# Patient Record
Sex: Male | Born: 1975 | Race: White | Hispanic: No | Marital: Single | Smoking: Never smoker
Health system: Southern US, Community
[De-identification: ages and names within clinical notes are randomized; demographics above are authoritative.]

## PROBLEM LIST (undated history)

## (undated) DIAGNOSIS — F32A Depression, unspecified: Secondary | ICD-10-CM

## (undated) DIAGNOSIS — Q6589 Other specified congenital deformities of hip: Secondary | ICD-10-CM

## (undated) DIAGNOSIS — D649 Anemia, unspecified: Secondary | ICD-10-CM

## (undated) DIAGNOSIS — F329 Major depressive disorder, single episode, unspecified: Secondary | ICD-10-CM

## (undated) DIAGNOSIS — E039 Hypothyroidism, unspecified: Secondary | ICD-10-CM

## (undated) DIAGNOSIS — K219 Gastro-esophageal reflux disease without esophagitis: Secondary | ICD-10-CM

---

## 2005-10-18 ENCOUNTER — Emergency Department (HOSPITAL_COMMUNITY): Admission: EM | Admit: 2005-10-18 | Discharge: 2005-10-19 | Payer: Self-pay | Admitting: Emergency Medicine

## 2006-02-13 ENCOUNTER — Ambulatory Visit (HOSPITAL_COMMUNITY): Admission: RE | Admit: 2006-02-13 | Discharge: 2006-02-13 | Payer: Self-pay | Admitting: Internal Medicine

## 2006-02-16 ENCOUNTER — Encounter: Admission: RE | Admit: 2006-02-16 | Discharge: 2006-02-16 | Payer: Self-pay | Admitting: Internal Medicine

## 2006-06-30 ENCOUNTER — Ambulatory Visit: Payer: Self-pay | Admitting: Gastroenterology

## 2006-07-14 ENCOUNTER — Ambulatory Visit: Payer: Self-pay | Admitting: Gastroenterology

## 2006-07-14 ENCOUNTER — Encounter: Payer: Self-pay | Admitting: Gastroenterology

## 2006-08-15 ENCOUNTER — Ambulatory Visit: Payer: Self-pay | Admitting: Gastroenterology

## 2006-08-17 ENCOUNTER — Ambulatory Visit (HOSPITAL_COMMUNITY): Admission: RE | Admit: 2006-08-17 | Discharge: 2006-08-17 | Payer: Self-pay | Admitting: Gastroenterology

## 2006-09-06 ENCOUNTER — Ambulatory Visit (HOSPITAL_COMMUNITY): Admission: RE | Admit: 2006-09-06 | Discharge: 2006-09-06 | Payer: Self-pay | Admitting: Gastroenterology

## 2006-09-18 ENCOUNTER — Ambulatory Visit: Payer: Self-pay | Admitting: Gastroenterology

## 2007-12-18 ENCOUNTER — Ambulatory Visit: Payer: Self-pay | Admitting: Gastroenterology

## 2007-12-18 LAB — CONVERTED CEMR LAB
Hgb A: 92 % — ABNORMAL LOW (ref 96.8–97.8)
Hgb F Quant: 2.7 % — ABNORMAL HIGH (ref 0.0–2.0)
Hgb S Quant: 0 % (ref 0.0–0.0)
Iron: 54 ug/dL (ref 42–165)
Tissue Transglutaminase Ab, IgA: 0.5 units (ref <7)
Vitamin B-12: 617 pg/mL (ref 211–911)

## 2007-12-25 ENCOUNTER — Telehealth: Payer: Self-pay | Admitting: Gastroenterology

## 2007-12-31 ENCOUNTER — Ambulatory Visit: Payer: Self-pay | Admitting: Hematology and Oncology

## 2008-01-31 ENCOUNTER — Encounter: Payer: Self-pay | Admitting: Gastroenterology

## 2009-02-09 ENCOUNTER — Ambulatory Visit: Payer: Self-pay | Admitting: Internal Medicine

## 2009-02-16 ENCOUNTER — Encounter (HOSPITAL_COMMUNITY): Admission: RE | Admit: 2009-02-16 | Discharge: 2009-05-17 | Payer: Self-pay | Admitting: Internal Medicine

## 2009-02-23 ENCOUNTER — Encounter: Payer: Self-pay | Admitting: Gastroenterology

## 2009-02-23 LAB — CBC & DIFF AND RETIC
BASO%: 0.8 % (ref 0.0–2.0)
EOS%: 5.9 % (ref 0.0–7.0)
IRF: 0.38 (ref 0.070–0.380)
MCH: 19.4 pg — ABNORMAL LOW (ref 27.2–33.4)
MCHC: 31.8 g/dL — ABNORMAL LOW (ref 32.0–36.0)
MONO#: 0.6 10*3/uL (ref 0.1–0.9)
RDW: 15.9 % — ABNORMAL HIGH (ref 11.0–14.6)
RETIC #: 160.9 10*3/uL — ABNORMAL HIGH (ref 31.8–103.9)
Retic %: 2.9 % — ABNORMAL HIGH (ref 0.7–2.3)
WBC: 5.2 10*3/uL (ref 4.0–10.3)
lymph#: 2.2 10*3/uL (ref 0.9–3.3)

## 2009-02-25 LAB — HEMOGLOBINOPATHY EVALUATION: Hgb S Quant: 0 % (ref 0.0–0.0)

## 2009-02-25 LAB — PROTEIN ELECTROPHORESIS, SERUM
Albumin ELP: 63.5 % (ref 55.8–66.1)
Alpha-1-Globulin: 3.4 % (ref 2.9–4.9)
Alpha-2-Globulin: 8.4 % (ref 7.1–11.8)
Beta 2: 3.8 % (ref 3.2–6.5)

## 2009-02-25 LAB — COMPREHENSIVE METABOLIC PANEL
ALT: 51 U/L (ref 0–53)
AST: 26 U/L (ref 0–37)
Alkaline Phosphatase: 79 U/L (ref 39–117)
Creatinine, Ser: 1.23 mg/dL (ref 0.40–1.50)
Sodium: 137 mEq/L (ref 135–145)
Total Bilirubin: 0.9 mg/dL (ref 0.3–1.2)

## 2009-02-25 LAB — IRON AND TIBC
%SAT: 36 % (ref 20–55)
TIBC: 259 ug/dL (ref 215–435)

## 2009-02-25 LAB — FERRITIN: Ferritin: 160 ng/mL (ref 22–322)

## 2009-02-25 LAB — VITAMIN B12: Vitamin B-12: 764 pg/mL (ref 211–911)

## 2009-02-25 LAB — FOLATE: Folate: >20 ng/mL

## 2009-02-25 LAB — LACTATE DEHYDROGENASE: LDH: 103 U/L (ref 94–250)

## 2010-07-09 ENCOUNTER — Encounter: Admission: RE | Admit: 2010-07-09 | Discharge: 2010-07-09 | Payer: Self-pay | Admitting: Internal Medicine

## 2010-12-28 HISTORY — PX: WISDOM TOOTH EXTRACTION: SHX21

## 2011-01-11 NOTE — Assessment & Plan Note (Signed)
[facility]                         GASTROENTEROLOGY OFFICE NOTE   NAME:HOLCOMBECarmon, Mcdonald                    MRN:          [id number]  DATE:12/18/2007                            DOB:          1976-03-16    Cody Mcdonald is referred because of anemia which he says I have had all  my life.  His mother has thalassemia, and he apparently has such,  although he has never been formally tested.  He recently had hemoglobin  checked at Dr. Vicente Mcdonald office which was 9.4 with a 12% iron saturation  with a serum iron of 30 and total iron binding capacity of 212.  The  patient really denies ANY GI complaints to me whatsoever.  He is having  regular bowel movements without melena or hematochezia.   I previously saw him because of noncardiac chest pain in January of last  year, and he had endoscopy, esophageal monometry, and 24-hour pH probe  test, all of which were normal.  He denies chest pain or swallowing  problems at this time.  Because of his chest pain, he previously did  have esophageal biopsies that were all normal.  CT scan of the chest  also was normal.  He currently is on no medications except  multivitamins.   PHYSICAL EXAMINATION:  GENERAL:  He is awake and alert, in no acute  distress.  VITAL SIGNS:  He weighs 157 pounds.  Blood pressure 120/70, pulse 80 and  regular.  I could not appreciate stigmata of chronic liver disease.  CHEST:  Exam unremarkable.  CARDIAC:  Exam unremarkable.  ABDOMEN:  There was no hepatosplenomegaly, abdominal masses or  tenderness.  RECTAL:  Inspection of the rectum was normal as was rectal exam.  Stools  guaiac negative.   ASSESSMENT:  1. 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  2. Rule out celiac disease.  3. Family history of colon polyps and will schedule colonoscopy.The      patient wants to wait till labs are back however.   RECOMMENDATIONS:  1. Outpatient colonoscopy exam at his convenience.  2. Check hemoglobin  electrophoresis.  3. Check celiac panel.     Cody Mcdonald. Cody Motto, MD, Cody Mcdonald, FAGA  Electronically Signed    DRP/MedQ  DD: 12/18/2007  DT: 12/18/2007  Job #: [id number]   cc:   Cody Mcdonald, M.D.

## 2011-01-14 NOTE — Assessment & Plan Note (Signed)
[facility]                           GASTROENTEROLOGY OFFICE NOTE   NAME:HOLCOMBEDejaun, Vidrio                    MRN:          [id number]  DATE:06/30/2006                            DOB:          10/14/1975    Mr. Waymire is a 35 year old white male employee of [employer].  He is referred  through the courtesy of Dr. Felipa Eth for evaluation of atypical chest pain.   Mr. Leifheit was in good health and having no problems until April of this  past year when he suddenly developed burning and pressing mid substernal  chest pain with some reflux symptoms.  They were initially episodic  episodes, and he was evaluated at [facility], apparently had a  negative cardiac workup and upper abdominal ultrasound exam.  Since that  time, he has had almost daily occurrences of similar presence of sternal  chest pain associated with some burning, also a globus sensation in his  throat.  The pain does not radiate to his back.  There is no true dysphagia  but he notices that swallowing liquids seems to soothe his pain.  He has  been on Prilosec without too much improvement and he uses frequent antacids.  He had an upper GI series performed on February 13, 2006, that was entirely  unremarkable per Dr. Audie Pinto.  Lab data showed a normal CBC and metabolic  profile.   The patient denies any specific hepatobiliary complaints, clay-colored  stools, dark urine, icterus, fever, or chills.  His appetite is good.  He  actually is gaining weight because he is eating frequent, small meals.  He  has noticed that chocolate and caffeine and spicy foods make his chest pain  worse.  He has no nausea and vomiting.  His bowels move well without melena  or hematochezia.   PAST MEDICAL HISTORY:  Otherwise entirely noncontributory.   MEDICATIONS:  1. Prilosec daily.  2. He was using a large amount of Benadryl for sleep but has not done this      in several months.   FAMILY HISTORY:   Remarkable for his mother who apparently had ovarian  carcinoma spread to the colon.  His mother did suffer from colon polyps.   SOCIAL HISTORY:  The patient is single and lives alone.  He works at [employer],  has a Naval architect.  He does not smoke but uses ethanol socially.  He  denies problems with alcohol abuse.   REVIEW OF SYSTEMS:  Negative without any cardiovascular or pulmonary  complaints.  He denies Raynaud's phenomenon or other symptoms of collagen  vascular disease.   EXAM:  VITAL SIGNS:  He is 5 feet 9-1/2 inches tall, weighs 153 pounds.  Blood pressure 110/80 and pulse was 88 and regular.  He is a healthy appearing white male in no acute distress, appearing his  stated age.  I could not appreciate a stigmata of chronic liver disease or thyromegaly.  His chest was entirely clear and there were no murmurs, gallops, or rubs  noted.  He appeared to be in a regular rhythm.  I could not appreciate hepatosplenomegaly, abdominal masses,  or tenderness.  Bowel sounds were normal.  Peripheral extremities were unremarkable.  Mental status was normal.   ASSESSMENT:  Mr. Bromwell most likely has acid reflux and completely treated  by low-dose and proton-pump inhibitor therapy.  Other consideration would be  that he has eosinophilic esophagitis or esophageal motility disorder.   RECOMMENDATIONS:  1. I am trying to get a copy of the ultrasound report from [facility].  2. Strict antireflux regimen and I will start AcipHex 20 mg 30 minutes      before breakfast and supper with p.r.n. sublingual and hyoscyamine      0.125 mg use.  3. Outpatient endoscopic exam with esophageal biopsies for eosinophilic      esophagitis.  4. Medical followup with Dr. Felipa Eth.     Vania Rea. Jarold Motto, MD, Caleen Essex, FAGA  Electronically Signed    DRP/MedQ  DD: 06/30/2006  DT: 06/30/2006  Job #: [id number]   cc:   Larina Earthly, M.D.

## 2011-01-14 NOTE — Assessment & Plan Note (Signed)
[facility]                         GASTROENTEROLOGY OFFICE NOTE   NAME:HOLCOMBEAmillion, Macchia                    MRN:          [id number]  DATE:08/15/2006                            DOB:          1975/11/10    PROBLEM:  Followup chest pain.   HISTORY:  Hoyle is a pleasant, 35 year old, white male who was recently  evaluated by Dr. Jarold Motto for persistent chest pain which has been  present since April 2007. He was referred by Dr. Felipa Eth, had had prior  negative cardiac workup done through [facility] and also has had  an upper GI series which was unremarkable. It was felt that his symptoms  may be due to acid reflux with poor control with low-dose PPI. Also felt  that eosinophilic esophagitis or other motility disorder should be ruled  out. He has since undergone upper endoscopy which did show a fairly  large hiatal hernia at 4 cm but no evidence of erosive esophagitis.  Biopsies were taken and these were negative for eosinophilic  infiltration. He has been on b.i.d. AcipHex over the past 4 weeks and  comes in today for followup. He says he really does not feel any  different on the AcipHex and also tried the Levbid without any  improvement in his symptoms. Interestingly he is having symptoms on a  daily basis which usually start at 8 or 9 o'clock in the morning and  then persist until about 6 o'clock in the evening which he says perhaps  is related to after eating his last meal. He describes it as a tightness  or pressure sensation in his chest or squeezing sensation and he says it  feels like there is a bowling ball sitting in his chest. He has also had  some associated tightness in his throat and some intermittent dizziness.  His appetite is fair, he says his weight is stable. He is not having any  particular odynophagia though still has a sense of having some  difficulty getting things down. He appears anxious and was started on  Cymbalta by Dr.  Felipa Eth 2 days ago which he is to take for a 1 month trial.  He denies any particular feeling of stress or anxiety, denies depressive  symptoms. His pain is nonexertional.   CURRENT MEDICATIONS:  1. AcipHex 20 b.i.d.  2. Cymbalta (he is uncertain of the dosage) 1 daily.   PHYSICAL EXAMINATION:  GENERAL:  Well-developed, thin, white male in no  acute distress.  VITAL SIGNS:  Weight is 146, blood pressure 144/82, pulse is 96.  CARDIOVASCULAR:  Regular rate and rhythm. Slightly tachy, no murmur, rub  or gallop.  PULMONARY:  Clear to A&P.  ABDOMEN:  Soft and nontender.   IMPRESSION:  A 35 year old white male with persistent chest pain thus  far nonresponsive to high-dose proton pump inhibitor. He does have a  hiatal hernia and it is possible that he has refractory reflux symptoms.  Also need to rule out motility disorder with spasm, underlying mass  lesion of the chest or anxiety.   PLAN:  1. Continue AcipHex but decrease to once daily.  2.  Schedule for a CT scan of the chest.  3. If CT of the chest is unremarkable will proceed with esophageal      manometry and pH study.      Mike Gip, PA-C  Electronically Signed      Vania Rea. Jarold Motto, MD, Caleen Essex, FAGA  Electronically Signed   AE/MedQ  DD: 08/15/2006  DT: 08/16/2006  Job #: [id number]   cc:   Larina Earthly, M.D.

## 2011-01-14 NOTE — Assessment & Plan Note (Signed)
[facility]                         GASTROENTEROLOGY OFFICE NOTE   NAME:HOLCOMBETiquan, Bouch                    MRN:          [id number]  DATE:09/06/2006                            DOB:          May 08, 1976    STUDY:  A  24 HOUR PH PROBE TEST   1. A 24 hour pH protest entirely normal without any evidence of acid      reflux whatsoever in the upright position, recumbent position and      both proximal or distal probes.  Total DeMeester score of 5.8,      normal less than 22.0.  There also is no symptom index positivity      whatsoever.  2. Esophageal manometry was completed without difficulty.  The results      are as follows:  1a. Upper esophageal sphincter - there is normal coordination between  pharyngeal contraction and cricopharyngeal relaxation.  b.  Esophageal motility.  There is normal peristalsis throughout the  length of the esophagus with wet and dry swallows. Normal amplitude of  esophageal contractions is 110 mm of mercury.  c.  Lower esophageal sphincter - mean pressure is normal at 35 mm of  mercury with normal relaxation swallowing.   ASSESSMENT:  This is a subnormal esophageal monometry without evidence  of a lower esophageal sphincter incompetency and/or a esophageal  motility disorder.   Overall these 2 studies were entirely normal and this pretty much rules  out an esophageal source for this patient's chest pain.     Vania Rea. Jarold Motto, MD, Caleen Essex, FAGA  Electronically Signed    DRP/MedQ  DD: 09/12/2006  DT: 09/13/2006  Job #: [id number]

## 2013-09-30 ENCOUNTER — Other Ambulatory Visit: Payer: Self-pay | Admitting: Orthopedic Surgery

## 2013-10-02 ENCOUNTER — Encounter (HOSPITAL_COMMUNITY): Payer: Self-pay

## 2013-10-02 ENCOUNTER — Ambulatory Visit (HOSPITAL_COMMUNITY)
Admission: RE | Admit: 2013-10-02 | Discharge: 2013-10-02 | Disposition: A | Discharge status: Home / Self Care | Payer: BC Managed Care – PPO | Source: Ambulatory Visit | Attending: Orthopedic Surgery | Admitting: Orthopedic Surgery

## 2013-10-02 ENCOUNTER — Encounter (HOSPITAL_COMMUNITY)
Admission: RE | Admit: 2013-10-02 | Discharge: 2013-10-02 | Disposition: A | Discharge status: Home / Self Care | Payer: BC Managed Care – PPO | Source: Ambulatory Visit | Attending: Orthopedic Surgery | Admitting: Orthopedic Surgery

## 2013-10-02 DIAGNOSIS — Z01810 Encounter for preprocedural cardiovascular examination: Secondary | ICD-10-CM | POA: Insufficient documentation

## 2013-10-02 DIAGNOSIS — Z01812 Encounter for preprocedural laboratory examination: Secondary | ICD-10-CM | POA: Insufficient documentation

## 2013-10-02 DIAGNOSIS — E039 Hypothyroidism, unspecified: Secondary | ICD-10-CM | POA: Insufficient documentation

## 2013-10-02 DIAGNOSIS — D649 Anemia, unspecified: Secondary | ICD-10-CM | POA: Insufficient documentation

## 2013-10-02 DIAGNOSIS — I4510 Unspecified right bundle-branch block: Secondary | ICD-10-CM | POA: Insufficient documentation

## 2013-10-02 DIAGNOSIS — Q6589 Other specified congenital deformities of hip: Secondary | ICD-10-CM | POA: Insufficient documentation

## 2013-10-02 HISTORY — DX: Hypothyroidism, unspecified: E03.9

## 2013-10-02 HISTORY — DX: Anemia, unspecified: D64.9

## 2013-10-02 LAB — CBC WITH DIFFERENTIAL/PLATELET
BASOS PCT: 1 % (ref 0–1)
Basophils Absolute: 0.1 10*3/uL (ref 0.0–0.1)
EOS PCT: 4 % (ref 0–5)
Eosinophils Absolute: 0.2 10*3/uL (ref 0.0–0.7)
HCT: 33 % — ABNORMAL LOW (ref 39.0–52.0)
HEMOGLOBIN: 10.9 g/dL — AB (ref 13.0–17.0)
LYMPHS PCT: 32 % (ref 12–46)
Lymphs Abs: 1.7 10*3/uL (ref 0.7–4.0)
MCH: 21.7 pg — AB (ref 26.0–34.0)
MCHC: 33 g/dL (ref 30.0–36.0)
MCV: 65.6 fL — ABNORMAL LOW (ref 78.0–100.0)
Monocytes Absolute: 0.9 10*3/uL (ref 0.1–1.0)
Monocytes Relative: 18 % — ABNORMAL HIGH (ref 3–12)
NEUTROS PCT: 45 % (ref 43–77)
Neutro Abs: 2.3 10*3/uL (ref 1.7–7.7)
Platelets: 157 10*3/uL (ref 150–400)
RBC: 5.03 MIL/uL (ref 4.22–5.81)
RDW: 15.9 % — ABNORMAL HIGH (ref 11.5–15.5)
WBC: 5.2 10*3/uL (ref 4.0–10.5)

## 2013-10-02 LAB — BASIC METABOLIC PANEL
BUN: 21 mg/dL (ref 6–23)
CO2: 26 mEq/L (ref 19–32)
Calcium: 9.3 mg/dL (ref 8.4–10.5)
Chloride: 104 mEq/L (ref 96–112)
Creatinine, Ser: 1.23 mg/dL (ref 0.50–1.35)
GFR calc non Af Amer: 73 mL/min — ABNORMAL LOW (ref >90)
GFR, EST AFRICAN AMERICAN: 84 mL/min — AB (ref >90)
GLUCOSE: 78 mg/dL (ref 70–99)
POTASSIUM: 3.9 meq/L (ref 3.7–5.3)
SODIUM: 144 meq/L (ref 137–147)

## 2013-10-02 LAB — URINALYSIS, ROUTINE W REFLEX MICROSCOPIC
BILIRUBIN URINE: NEGATIVE
Glucose, UA: NEGATIVE mg/dL
Hgb urine dipstick: NEGATIVE
KETONES UR: NEGATIVE mg/dL
Leukocytes, UA: NEGATIVE
NITRITE: NEGATIVE
Protein, ur: NEGATIVE mg/dL
Specific Gravity, Urine: 1.029 (ref 1.005–1.030)
Urobilinogen, UA: 1 mg/dL (ref 0.0–1.0)
pH: 5.5 (ref 5.0–8.0)

## 2013-10-02 LAB — TYPE AND SCREEN
ABO/RH(D): O NEG
ANTIBODY SCREEN: NEGATIVE

## 2013-10-02 LAB — ABO/RH: ABO/RH(D): O NEG

## 2013-10-02 LAB — PROTIME-INR
INR: 1.04 (ref 0.00–1.49)
Prothrombin Time: 13.4 seconds (ref 11.6–15.2)

## 2013-10-02 LAB — SURGICAL PCR SCREEN
MRSA, PCR: NEGATIVE
Staphylococcus aureus: NEGATIVE

## 2013-10-02 LAB — APTT: aPTT: 30 seconds (ref 24–37)

## 2013-10-02 MED ORDER — CHLORHEXIDINE GLUCONATE 4 % EX LIQD: 60.0000 mL | Freq: Once | CUTANEOUS | Status: DC

## 2013-10-02 NOTE — Pre-Procedure Instructions (Signed)
Cody Mcdonald  10/02/2013   Your procedure is scheduled on:  Monday February 9 th at 1300 PM  Report to [facility] Short Stay Main Entrance "A"at 1100 AM.  Call this number if you have problems the morning of surgery: [phone]   Remember:   Do not eat food or drink liquids after midnight Sunday.   Take these medicines the morning of surgery with A SIP OF WATER: Hydrocodone-acetaminophen if needed for pain, and Levothyroxine(Synthroid)   Do not wear jewelry.  Do not wear lotions, powders, or colonge. You may wear deodorant.             Men may shave face and neck.  Do not bring valuables to the hospital.  [facility] is not responsible for any belongings or valuables.               Contacts, dentures or bridgework may not be worn into surgery.  Leave suitcase in the car. After surgery it may be brought to your room.  For patients admitted to the hospital, discharge time is determined by your treatment team.               Patients discharged the day of surgery will not be allowed to drive home.    Special Instructions: [facility] - Preparing for Surgery  Before surgery, you can play an important role.  Because skin is not sterile, your skin needs to be as free of germs as possible.  You can reduce the number of germs on you skin by washing with CHG (chlorahexidine gluconate) soap before surgery.  CHG is an antiseptic cleaner which kills germs and bonds with the skin to continue killing germs even after washing.  Please DO NOT use if you have an allergy to CHG or antibacterial soaps.  If your skin becomes reddened/irritated stop using the CHG and inform your nurse when you arrive at Short Stay.  Do not shave (including legs and underarms) for at least 48 hours prior to the first CHG shower.  You may shave your face.  Please follow these instructions carefully:   1.  Shower with CHG Soap the night before surgery and the  morning of Surgery.  2.  If you choose to wash your hair,  wash your hair first as usual with your normal shampoo.  3.  After you shampoo, rinse your hair and body thoroughly to remove the  Shampoo.  4.  Use CHG as you would any other liquid soap.  You can apply chg directly  to the skin and wash gently with scrungie or a clean washcloth.  5.  Apply the CHG Soap to your body ONLY FROM THE NECK DOWN.  Do not use on open wounds or open sores.  Avoid contact with your eyes, ears, mouth and genitals (private parts).  Wash genitals (private parts) with your normal soap.  6.  Wash thoroughly, paying special attention to the area where your surgery will be performed.  7.  Thoroughly rinse your body with warm water from the neck down.  8.  DO NOT shower/wash with your normal soap after using and rinsing off the CHG Soap.  9.  Pat yourself dry with a clean towel.            10.  Wear clean pajamas.            11 .  Place clean sheets on your bed the night of your first shower and do not  sleep with  pets.  Day of Surgery  Do not apply any lotions/deoderants the morning of surgery.  Please wear clean clothes to the hospital/surgery center.      Please read over the following fact sheets that you were given: Pain Booklet, Coughing and Deep Breathing, Blood Transfusion Information, MRSA Information and Surgical Site Infection Prevention

## 2013-10-03 NOTE — Progress Notes (Signed)
Anesthesia Chart Review:  Patient is a 38 year old male scheduled for left THA on 10/07/13 by Dr. Turner Danielsowan.  History includes non-smoker, hypothyroidism, anemia.  Preoperative labs, CXR, and EKG noted.  Anticipate that he can proceed as planned.  Velna Ochsllison Jocelyn Nold, PA-C [facility]Anesthesiology Phone [phone] 10/03/2013 4:25 PM

## 2013-10-04 NOTE — H&P (Signed)
TOTAL HIP ADMISSION H&P  Patient is admitted for left total hip arthroplasty.  Subjective:  Chief Complaint: left hip pain  HPI: Cody Mcdonald, 38 y.o. male, has a history of pain and functional disability in the left hip(s) due to arthritis and patient has failed non-surgical conservative treatments for greater than 12 weeks to include NSAID's and/or analgesics, flexibility and strengthening excercises and activity modification.  Onset of symptoms was gradual starting >10 years ago with gradually worsening course since that time.The patient noted no past surgery on the bilaterally hip(s).  Patient currently rates pain in the left hip at 10 out of 10 with activity. Patient has night pain, worsening of pain with activity and weight bearing, pain that interfers with activities of daily living, pain with passive range of motion and crepitus. Patient has evidence of hip dysplasia by imaging studies. This condition presents safety issues increasing the risk of falls. This patient has had bilateral hip dysplasia.  There is no current active infection.  There are no active problems to display for this patient.  Past Medical History  Diagnosis Date  . Hypothyroidism   . Anemia     thalassemia    Past Surgical History  Procedure Laterality Date  . Wisdom tooth extraction  12/2010    No prescriptions prior to admission   No Known Allergies  History  Substance Use Topics  . Smoking status: Never Smoker   . Smokeless tobacco: Former NeurosurgeonUser    Types: Chew  . Alcohol Use: No    No family history on file.   Review of Systems  Constitutional: Positive for weight loss.  HENT: Negative.   Eyes: Negative.   Respiratory: Negative.   Cardiovascular: Negative.   Gastrointestinal: Negative.   Genitourinary: Negative.   Musculoskeletal: Positive for joint pain.  Skin: Negative.   Neurological: Negative.   Endo/Heme/Allergies:       The patient also has a blood thalassemia with chronic anemia   Psychiatric/Behavioral: Negative.     Objective:  Physical Exam  Constitutional: He is oriented to person, place, and time. He appears well-developed and well-nourished.  HENT:  Head: Normocephalic and atraumatic.  Eyes: Pupils are equal, round, and reactive to light.  Neck: Normal range of motion. Neck supple.  Cardiovascular: Intact distal pulses.   Respiratory: Effort normal.  GI: Soft.  Musculoskeletal:  Internal and external rotation of both hips is around 15, foot tap is negative, flexion is to about 110, 10 flexion contractures bilaterally.    Neurological: He is alert and oriented to person, place, and time.  Skin: Skin is warm and dry.  Psychiatric: He has a normal mood and affect. His behavior is normal. Judgment and thought content normal.    Vital signs in last 24 hours:    Labs:   There is no height or weight on file to calculate BMI.   Imaging Review X-rays reviewed on [facility] show impressive dysplasia with flattening of the femoral heads and dysplastic acetabuli.  Articular cartilage height is partially maintained throughout.  He has a high neck shaft angle on the left, less so on the right.  Assessment/Plan:  End stage arthritis, left hip(s)  The patient history, physical examination, clinical judgement of the provider and imaging studies are consistent with end stage degenerative joint disease of the left hip(s) and total hip arthroplasty is deemed medically necessary. The treatment options including medical management, injection therapy, arthroscopy and arthroplasty were discussed at length. The risks and benefits of  total hip arthroplasty were presented and reviewed. The risks due to aseptic loosening, infection, stiffness, dislocation/subluxation,  thromboembolic complications and other imponderables were discussed.  The patient acknowledged the explanation, agreed to proceed with the plan and consent was signed. Patient is being admitted for  inpatient treatment for surgery, pain control, PT, OT, prophylactic antibiotics, VTE prophylaxis, progressive ambulation and ADL's and discharge planning.The patient is planning to be discharged home with home health services

## 2013-10-04 NOTE — Progress Notes (Signed)
Left VM with new arrival time and return call number

## 2013-10-06 MED ORDER — CEFAZOLIN SODIUM-DEXTROSE 2-3 GM-% IV SOLR
2.0000 g | INTRAVENOUS | Status: AC
  Administered 2013-10-07: 2 g via INTRAVENOUS
  Filled 2013-10-06: qty 50

## 2013-10-07 ENCOUNTER — Encounter (HOSPITAL_COMMUNITY)
Admission: RE | Disposition: A | Discharge status: Home / Self Care | Payer: Self-pay | Source: Ambulatory Visit | Attending: Orthopedic Surgery

## 2013-10-07 ENCOUNTER — Inpatient Hospital Stay (HOSPITAL_COMMUNITY): Payer: BC Managed Care – PPO | Admitting: Anesthesiology

## 2013-10-07 ENCOUNTER — Encounter (HOSPITAL_COMMUNITY): Payer: Self-pay | Admitting: Anesthesiology

## 2013-10-07 ENCOUNTER — Encounter (HOSPITAL_COMMUNITY): Payer: BC Managed Care – PPO | Admitting: Vascular Surgery

## 2013-10-07 ENCOUNTER — Inpatient Hospital Stay (HOSPITAL_COMMUNITY)
Admission: RE | Admit: 2013-10-07 | Discharge: 2013-10-09 | DRG: 470 | Disposition: A | Discharge status: Home / Self Care | Payer: BC Managed Care – PPO | Source: Ambulatory Visit | Attending: Orthopedic Surgery | Admitting: Orthopedic Surgery

## 2013-10-07 ENCOUNTER — Inpatient Hospital Stay (HOSPITAL_COMMUNITY): Payer: BC Managed Care – PPO

## 2013-10-07 DIAGNOSIS — M1610 Unilateral primary osteoarthritis, unspecified hip: Principal | ICD-10-CM | POA: Diagnosis present

## 2013-10-07 DIAGNOSIS — Q6589 Other specified congenital deformities of hip: Secondary | ICD-10-CM

## 2013-10-07 DIAGNOSIS — M169 Osteoarthritis of hip, unspecified: Principal | ICD-10-CM | POA: Diagnosis present

## 2013-10-07 DIAGNOSIS — E039 Hypothyroidism, unspecified: Secondary | ICD-10-CM | POA: Diagnosis present

## 2013-10-07 DIAGNOSIS — K219 Gastro-esophageal reflux disease without esophagitis: Secondary | ICD-10-CM | POA: Diagnosis present

## 2013-10-07 DIAGNOSIS — D569 Thalassemia, unspecified: Secondary | ICD-10-CM | POA: Diagnosis present

## 2013-10-07 HISTORY — PX: TOTAL HIP ARTHROPLASTY: SHX124

## 2013-10-07 HISTORY — DX: Gastro-esophageal reflux disease without esophagitis: K21.9

## 2013-10-07 HISTORY — DX: Other specified congenital deformities of hip: Q65.89

## 2013-10-07 SURGERY — ARTHROPLASTY, HIP, TOTAL,POSTERIOR APPROACH
Anesthesia: General | Site: Hip | Laterality: Left

## 2013-10-07 MED ORDER — ACETAMINOPHEN 325 MG PO TABS
650.0000 mg | ORAL_TABLET | Freq: Four times a day (QID) | ORAL | Status: DC | PRN
  Administered 2013-10-08 – 2013-10-09 (×2): 650 mg via ORAL
  Filled 2013-10-07 (×2): qty 2

## 2013-10-07 MED ORDER — HYDROMORPHONE HCL PF 1 MG/ML IJ SOLN
0.5000 mg | INTRAMUSCULAR | Status: AC | PRN
  Administered 2013-10-07 (×4): 0.5 mg via INTRAVENOUS

## 2013-10-07 MED ORDER — MIDAZOLAM HCL 2 MG/2ML IJ SOLN
INTRAMUSCULAR | Status: AC
  Filled 2013-10-07: qty 2

## 2013-10-07 MED ORDER — ONDANSETRON HCL 4 MG PO TABS: 4.0000 mg | ORAL_TABLET | Freq: Four times a day (QID) | ORAL | Status: DC | PRN

## 2013-10-07 MED ORDER — FENTANYL CITRATE 0.05 MG/ML IJ SOLN
INTRAMUSCULAR | Status: DC | PRN
  Administered 2013-10-07: 100 ug via INTRAVENOUS
  Administered 2013-10-07 (×5): 50 ug via INTRAVENOUS
  Administered 2013-10-07: 100 ug via INTRAVENOUS
  Administered 2013-10-07: 50 ug via INTRAVENOUS

## 2013-10-07 MED ORDER — MAGNESIUM CITRATE PO SOLN: 1.0000 | Freq: Once | ORAL | Status: AC | PRN

## 2013-10-07 MED ORDER — ROCURONIUM BROMIDE 50 MG/5ML IV SOLN
INTRAVENOUS | Status: AC
  Filled 2013-10-07: qty 1

## 2013-10-07 MED ORDER — OXYCODONE HCL 5 MG/5ML PO SOLN: 5.0000 mg | Freq: Once | ORAL | Status: AC | PRN

## 2013-10-07 MED ORDER — METOCLOPRAMIDE HCL 10 MG PO TABS: 5.0000 mg | ORAL_TABLET | Freq: Three times a day (TID) | ORAL | Status: DC | PRN

## 2013-10-07 MED ORDER — ONDANSETRON HCL 4 MG/2ML IJ SOLN
INTRAMUSCULAR | Status: DC | PRN
Start: 2013-10-07 — End: 2013-10-07
  Administered 2013-10-07: 4 mg via INTRAVENOUS

## 2013-10-07 MED ORDER — LIDOCAINE HCL (CARDIAC) 20 MG/ML IV SOLN
INTRAVENOUS | Status: DC | PRN
  Administered 2013-10-07: 80 mg via INTRAVENOUS

## 2013-10-07 MED ORDER — ONDANSETRON HCL 4 MG/2ML IJ SOLN: 4.0000 mg | Freq: Four times a day (QID) | INTRAMUSCULAR | Status: DC | PRN

## 2013-10-07 MED ORDER — MENTHOL 3 MG MT LOZG: 1.0000 | LOZENGE | OROMUCOSAL | Status: DC | PRN

## 2013-10-07 MED ORDER — NEOSTIGMINE METHYLSULFATE 1 MG/ML IJ SOLN
INTRAMUSCULAR | Status: AC
  Filled 2013-10-07: qty 10

## 2013-10-07 MED ORDER — PROMETHAZINE HCL 25 MG/ML IJ SOLN: 6.2500 mg | INTRAMUSCULAR | Status: DC | PRN

## 2013-10-07 MED ORDER — TRANEXAMIC ACID 100 MG/ML IV SOLN
1000.0000 mg | INTRAVENOUS | Status: AC
  Administered 2013-10-07: 1000 mg via INTRAVENOUS
  Filled 2013-10-07: qty 10

## 2013-10-07 MED ORDER — ASPIRIN EC 325 MG PO TBEC: 325.0000 mg | DELAYED_RELEASE_TABLET | Freq: Two times a day (BID) | ORAL | Status: DC

## 2013-10-07 MED ORDER — OXYCODONE-ACETAMINOPHEN 5-325 MG PO TABS: 1.0000 | ORAL_TABLET | ORAL | Status: DC | PRN

## 2013-10-07 MED ORDER — BUPIVACAINE-EPINEPHRINE (PF) 0.25% -1:200000 IJ SOLN
INTRAMUSCULAR | Status: AC
  Filled 2013-10-07: qty 30

## 2013-10-07 MED ORDER — METHOCARBAMOL 500 MG PO TABS: 500.0000 mg | ORAL_TABLET | Freq: Two times a day (BID) | ORAL | Status: DC

## 2013-10-07 MED ORDER — METHOCARBAMOL 100 MG/ML IJ SOLN
500.0000 mg | Freq: Four times a day (QID) | INTRAVENOUS | Status: DC | PRN
  Filled 2013-10-07: qty 5

## 2013-10-07 MED ORDER — BISACODYL 5 MG PO TBEC: 5.0000 mg | DELAYED_RELEASE_TABLET | Freq: Every day | ORAL | Status: DC | PRN

## 2013-10-07 MED ORDER — METOCLOPRAMIDE HCL 5 MG/ML IJ SOLN: 5.0000 mg | Freq: Three times a day (TID) | INTRAMUSCULAR | Status: DC | PRN

## 2013-10-07 MED ORDER — BUPIVACAINE-EPINEPHRINE PF 0.25-1:200000 % IJ SOLN
INTRAMUSCULAR | Status: DC | PRN
  Administered 2013-10-07: 20 mL via PERINEURAL

## 2013-10-07 MED ORDER — HYDROMORPHONE HCL PF 1 MG/ML IJ SOLN
INTRAMUSCULAR | Status: AC
  Filled 2013-10-07: qty 2

## 2013-10-07 MED ORDER — GLYCOPYRROLATE 0.2 MG/ML IJ SOLN
INTRAMUSCULAR | Status: AC
  Filled 2013-10-07: qty 2

## 2013-10-07 MED ORDER — OXYCODONE HCL 5 MG PO TABS
5.0000 mg | ORAL_TABLET | Freq: Once | ORAL | Status: AC | PRN
  Administered 2013-10-07: 5 mg via ORAL

## 2013-10-07 MED ORDER — PROPOFOL 10 MG/ML IV BOLUS
INTRAVENOUS | Status: DC | PRN
  Administered 2013-10-07: 200 mg via INTRAVENOUS

## 2013-10-07 MED ORDER — PHENOL 1.4 % MT LIQD: 1.0000 | OROMUCOSAL | Status: DC | PRN

## 2013-10-07 MED ORDER — NEOSTIGMINE METHYLSULFATE 1 MG/ML IJ SOLN
INTRAMUSCULAR | Status: DC | PRN
  Administered 2013-10-07: 3 mg via INTRAVENOUS

## 2013-10-07 MED ORDER — OXYCODONE HCL 5 MG PO TABS
ORAL_TABLET | ORAL | Status: AC
  Filled 2013-10-07: qty 1

## 2013-10-07 MED ORDER — METHOCARBAMOL 500 MG PO TABS
ORAL_TABLET | ORAL | Status: AC
  Filled 2013-10-07: qty 1

## 2013-10-07 MED ORDER — LACTATED RINGERS IV SOLN
INTRAVENOUS | Status: DC | PRN
  Administered 2013-10-07 (×2): via INTRAVENOUS

## 2013-10-07 MED ORDER — HYDROMORPHONE HCL PF 1 MG/ML IJ SOLN
INTRAMUSCULAR | Status: AC
  Filled 2013-10-07: qty 1

## 2013-10-07 MED ORDER — SENNOSIDES-DOCUSATE SODIUM 8.6-50 MG PO TABS: 1.0000 | ORAL_TABLET | Freq: Every evening | ORAL | Status: DC | PRN

## 2013-10-07 MED ORDER — DIPHENHYDRAMINE HCL 12.5 MG/5ML PO ELIX: 12.5000 mg | ORAL_SOLUTION | ORAL | Status: DC | PRN

## 2013-10-07 MED ORDER — PROPOFOL 10 MG/ML IV BOLUS
INTRAVENOUS | Status: AC
  Filled 2013-10-07: qty 20

## 2013-10-07 MED ORDER — KCL IN DEXTROSE-NACL 20-5-0.45 MEQ/L-%-% IV SOLN
INTRAVENOUS | Status: DC
  Administered 2013-10-07 – 2013-10-08 (×2): via INTRAVENOUS
  Administered 2013-10-08 – 2013-10-09 (×2): 125 mL/h via INTRAVENOUS
  Filled 2013-10-07 (×9): qty 1000

## 2013-10-07 MED ORDER — OXYCODONE HCL 5 MG PO TABS
5.0000 mg | ORAL_TABLET | ORAL | Status: DC | PRN
  Administered 2013-10-07 – 2013-10-09 (×7): 10 mg via ORAL
  Filled 2013-10-07 (×7): qty 2

## 2013-10-07 MED ORDER — FENTANYL CITRATE 0.05 MG/ML IJ SOLN
INTRAMUSCULAR | Status: AC
  Filled 2013-10-07: qty 5

## 2013-10-07 MED ORDER — MIDAZOLAM HCL 5 MG/5ML IJ SOLN
INTRAMUSCULAR | Status: DC | PRN
  Administered 2013-10-07: 2 mg via INTRAVENOUS

## 2013-10-07 MED ORDER — GLYCOPYRROLATE 0.2 MG/ML IJ SOLN
INTRAMUSCULAR | Status: DC | PRN
  Administered 2013-10-07: 0.4 mg via INTRAVENOUS

## 2013-10-07 MED ORDER — LEVOTHYROXINE SODIUM 50 MCG PO TABS
50.0000 ug | ORAL_TABLET | Freq: Every day | ORAL | Status: DC
  Administered 2013-10-08 – 2013-10-09 (×2): 50 ug via ORAL
  Filled 2013-10-07 (×3): qty 1

## 2013-10-07 MED ORDER — SODIUM CHLORIDE 0.9 % IR SOLN
Status: DC | PRN
Start: 2013-10-07 — End: 2013-10-07
  Administered 2013-10-07: 1000 mL

## 2013-10-07 MED ORDER — HYDROMORPHONE HCL PF 1 MG/ML IJ SOLN
0.2500 mg | INTRAMUSCULAR | Status: DC | PRN
  Administered 2013-10-07 (×4): 0.5 mg via INTRAVENOUS

## 2013-10-07 MED ORDER — ROCURONIUM BROMIDE 100 MG/10ML IV SOLN
INTRAVENOUS | Status: DC | PRN
  Administered 2013-10-07: 50 mg via INTRAVENOUS

## 2013-10-07 MED ORDER — DOCUSATE SODIUM 100 MG PO CAPS
100.0000 mg | ORAL_CAPSULE | Freq: Two times a day (BID) | ORAL | Status: DC
  Administered 2013-10-07 – 2013-10-09 (×4): 100 mg via ORAL
  Filled 2013-10-07 (×5): qty 1

## 2013-10-07 MED ORDER — METHOCARBAMOL 500 MG PO TABS
500.0000 mg | ORAL_TABLET | Freq: Four times a day (QID) | ORAL | Status: DC | PRN
  Administered 2013-10-07 – 2013-10-09 (×6): 500 mg via ORAL
  Filled 2013-10-07 (×5): qty 1

## 2013-10-07 MED ORDER — HYDROMORPHONE HCL PF 1 MG/ML IJ SOLN
1.0000 mg | INTRAMUSCULAR | Status: DC | PRN
  Administered 2013-10-07 – 2013-10-08 (×9): 1 mg via INTRAVENOUS
  Filled 2013-10-07 (×8): qty 1

## 2013-10-07 MED ORDER — SODIUM CHLORIDE 0.9 % IV SOLN: 1000.0000 mg | INTRAVENOUS | Status: DC

## 2013-10-07 MED ORDER — ASPIRIN EC 325 MG PO TBEC
325.0000 mg | DELAYED_RELEASE_TABLET | Freq: Every day | ORAL | Status: DC
  Administered 2013-10-08 – 2013-10-09 (×2): 325 mg via ORAL
  Filled 2013-10-07 (×3): qty 1

## 2013-10-07 MED ORDER — ACETAMINOPHEN 650 MG RE SUPP: 650.0000 mg | Freq: Four times a day (QID) | RECTAL | Status: DC | PRN

## 2013-10-07 MED ORDER — ONDANSETRON HCL 4 MG/2ML IJ SOLN
INTRAMUSCULAR | Status: AC
  Filled 2013-10-07: qty 2

## 2013-10-07 MED ORDER — LIDOCAINE HCL (CARDIAC) 20 MG/ML IV SOLN
INTRAVENOUS | Status: AC
  Filled 2013-10-07: qty 5

## 2013-10-07 SURGICAL SUPPLY — 51 items
BLADE SAW SGTL 18X1.27X75 (BLADE) ×2 IMPLANT
BLADE SAW SGTL 18X1.27X75MM (BLADE) ×1
BRUSH FEMORAL CANAL (MISCELLANEOUS) IMPLANT
CLOTH BEACON ORANGE TIMEOUT ST (SAFETY) ×3 IMPLANT
COVER BACK TABLE 24X17X13 BIG (DRAPES) IMPLANT
COVER SURGICAL LIGHT HANDLE (MISCELLANEOUS) ×6 IMPLANT
DRAPE ORTHO SPLIT 77X108 STRL (DRAPES) ×3
DRAPE PROXIMA HALF (DRAPES) ×3 IMPLANT
DRAPE SURG ORHT 6 SPLT 77X108 (DRAPES) ×1 IMPLANT
DRAPE U-SHAPE 47X51 STRL (DRAPES) ×3 IMPLANT
DRILL BIT 7/64X5 (BIT) ×3 IMPLANT
DRSG AQUACEL AG ADV 3.5X10 (GAUZE/BANDAGES/DRESSINGS) ×3 IMPLANT
DURAPREP 26ML APPLICATOR (WOUND CARE) ×3 IMPLANT
ELECT BLADE 4.0 EZ CLEAN MEGAD (MISCELLANEOUS)
ELECT REM PT RETURN 9FT ADLT (ELECTROSURGICAL) ×3
ELECTRODE BLDE 4.0 EZ CLN MEGD (MISCELLANEOUS) IMPLANT
ELECTRODE REM PT RTRN 9FT ADLT (ELECTROSURGICAL) ×1 IMPLANT
GAUZE XEROFORM 1X8 LF (GAUZE/BANDAGES/DRESSINGS) ×3 IMPLANT
GLOVE BIO SURGEON STRL SZ7.5 (GLOVE) ×3 IMPLANT
GLOVE BIO SURGEON STRL SZ8.5 (GLOVE) ×6 IMPLANT
GLOVE BIOGEL PI IND STRL 8 (GLOVE) ×2 IMPLANT
GLOVE BIOGEL PI IND STRL 9 (GLOVE) ×1 IMPLANT
GLOVE BIOGEL PI INDICATOR 8 (GLOVE) ×4
GLOVE BIOGEL PI INDICATOR 9 (GLOVE) ×2
GOWN PREVENTION PLUS XLARGE (GOWN DISPOSABLE) ×3 IMPLANT
GOWN STRL NON-REIN LRG LVL3 (GOWN DISPOSABLE) ×6 IMPLANT
GOWN STRL REIN XL XLG (GOWN DISPOSABLE) ×6 IMPLANT
HANDPIECE INTERPULSE COAX TIP (DISPOSABLE)
HOOD PEEL AWAY FACE SHEILD DIS (HOOD) ×6 IMPLANT
KIT BASIN OR (CUSTOM PROCEDURE TRAY) ×3 IMPLANT
KIT ROOM TURNOVER OR (KITS) ×3 IMPLANT
MANIFOLD NEPTUNE II (INSTRUMENTS) ×3 IMPLANT
NEEDLE 22X1 1/2 (OR ONLY) (NEEDLE) ×3 IMPLANT
NS IRRIG 1000ML POUR BTL (IV SOLUTION) ×3 IMPLANT
PACK TOTAL JOINT (CUSTOM PROCEDURE TRAY) ×3 IMPLANT
PAD ARMBOARD 7.5X6 YLW CONV (MISCELLANEOUS) ×6 IMPLANT
PASSER SUT SWANSON 36MM LOOP (INSTRUMENTS) ×3 IMPLANT
PRESSURIZER FEMORAL UNIV (MISCELLANEOUS) IMPLANT
SET HNDPC FAN SPRY TIP SCT (DISPOSABLE) IMPLANT
SUT ETHIBOND 2 V 37 (SUTURE) ×3 IMPLANT
SUT ETHILON 3 0 FSL (SUTURE) ×3 IMPLANT
SUT VIC AB 0 CTB1 27 (SUTURE) ×3 IMPLANT
SUT VIC AB 1 CTX 36 (SUTURE) ×3
SUT VIC AB 1 CTX36XBRD ANBCTR (SUTURE) ×1 IMPLANT
SUT VIC AB 2-0 CTB1 (SUTURE) ×3 IMPLANT
SYR CONTROL 10ML LL (SYRINGE) ×3 IMPLANT
TOWEL OR 17X24 6PK STRL BLUE (TOWEL DISPOSABLE) ×3 IMPLANT
TOWEL OR 17X26 10 PK STRL BLUE (TOWEL DISPOSABLE) ×3 IMPLANT
TOWER CARTRIDGE SMART MIX (DISPOSABLE) IMPLANT
TRAY FOLEY CATH 14FR (SET/KITS/TRAYS/PACK) IMPLANT
WATER STERILE IRR 1000ML POUR (IV SOLUTION) ×12 IMPLANT

## 2013-10-07 NOTE — Anesthesia Preprocedure Evaluation (Signed)
Anesthesia Evaluation  Patient identified by MRN, date of birth, ID band Patient awake    Reviewed: Allergy & Precautions, H&P   History of Anesthesia Complications Negative for: history of anesthetic complications  Airway Mallampati: I  Neck ROM: Full    Dental  (+) Teeth Intact   Pulmonary neg pulmonary ROS,  breath sounds clear to auscultation        Cardiovascular negative cardio ROS  Rhythm:Regular Rate:Normal     Neuro/Psych negative neurological ROS     GI/Hepatic negative GI ROS, Neg liver ROS,   Endo/Other  Hypothyroidism   Renal/GU negative Renal ROS     Musculoskeletal   Abdominal   Peds  Hematology   Anesthesia Other Findings   Reproductive/Obstetrics                           Anesthesia Physical Anesthesia Plan  ASA: I  Anesthesia Plan: General   Post-op Pain Management:    Induction: Intravenous  Airway Management Planned: Oral ETT  Additional Equipment:   Intra-op Plan:   Post-operative Plan: Extubation in OR  Informed Consent: I have reviewed the patients History and Physical, chart, labs and discussed the procedure including the risks, benefits and alternatives for the proposed anesthesia with the patient or authorized representative who has indicated his/her understanding and acceptance.   Dental advisory given  Plan Discussed with: CRNA and Surgeon  Anesthesia Plan Comments:         Anesthesia Quick Evaluation

## 2013-10-07 NOTE — Preoperative (Signed)
Beta Blockers   Reason not to administer Beta Blockers:Not Applicable 

## 2013-10-07 NOTE — Anesthesia Postprocedure Evaluation (Signed)
  Anesthesia Post-op Note  Patient: Cody Mcdonald  Procedure(s) Performed: Procedure(s): TOTAL HIP ARTHROPLASTY (Left)  Patient Location: PACU  Anesthesia Type:General  Level of Consciousness: awake and alert   Airway and Oxygen Therapy: Patient Spontanous Breathing  Post-op Pain: mild  Post-op Assessment: Post-op Vital signs reviewed  Post-op Vital Signs: stable  Complications: No apparent anesthesia complications

## 2013-10-07 NOTE — Plan of Care (Signed)
Problem: Consults Goal: Diagnosis- Total Joint Replacement Primary Total Hip Left     

## 2013-10-07 NOTE — Transfer of Care (Signed)
Immediate Anesthesia Transfer of Care Note  Patient: Cody Mcdonald  Procedure(s) Performed: Procedure(s): TOTAL HIP ARTHROPLASTY (Left)  Patient Location: PACU  Anesthesia Type:General  Level of Consciousness: awake and alert   Airway & Oxygen Therapy: Patient Spontanous Breathing and Patient connected to nasal cannula oxygen  Post-op Assessment: Report given to PACU RN and Post -op Vital signs reviewed and stable  Post vital signs: Reviewed and stable  Complications: No apparent anesthesia complications

## 2013-10-07 NOTE — Op Note (Signed)
OPERATIVE REPORT    DATE OF PROCEDURE:  10/07/2013       PREOPERATIVE DIAGNOSIS:  Epiphyseal DYSPLASIA LEFT HIP                                                          POSTOPERATIVE DIAGNOSIS:  Epiphyseal DYSPLASIA LEFT HIP                                                           PROCEDURE:  L total hip arthroplasty using a 52 mm DePuy Pinnacle  Cup, Apex Hole Eliminator, 10-degree polyethylene liner index superior  and posterior, a +3 36 mm ceramic head, a 18x13x160x36 SROM stem, 18Bsm Sleeve   SURGEON: Jnaya Butrick J    ASSISTANT:   Eric K. [employer]  (present throughout entire procedure and necessary for timely completion of the procedure)   ANESTHESIA: General BLOOD LOSS: 400 FLUID REPLACEMENT: 1600 crystalloid DRAINS: Foley Catheter URINE OUTPUT: 300cc COMPLICATIONS: none    INDICATIONS FOR PROCEDURE: A 38 y.o. year-old With  Epiphyseal DYSPLASIA LEFT HIP   for 5 years, x-rays show very shallow acetabulum and femoral head is flattened it like a mushroom. Despite conservative measures with observation, anti-inflammatory medicine, narcotics, use of a cane, has severe unremitting pain and can ambulate only a few blocks before resting.  Patient desires elective L total hip arthroplasty to decrease pain and increase function. The risks, benefits, and alternatives were discussed at length including but not limited to the risks of infection, bleeding, nerve injury, stiffness, blood clots, the need for revision surgery, cardiopulmonary complications, among others, and they were willing to proceed. Patient's deformities bilateral, we will intentionally length and him 1 cm at surgery today to improve the abductor mechanism. Questions answered     PROCEDURE IN DETAIL: The patient was identified by armband,  received preoperative IV antibiotics in the holding area at [facility], taken to the operating room , appropriate anesthetic monitors  were attached and general endotracheal  anesthesia induced. Foley catheter was inserted. Pt was rolled into the R lateral decubitus position and fixed there with a Stulberg Mark II pelvic clamp.  The L lower extremity was then prepped and draped  in the usual sterile fashion from the ankle to the hemipelvis. A time-out  procedure was performed. The skin along the lateral hip and thigh  infiltrated with 10 mL of 0.5% Marcaine and epinephrine solution. We  then made a posterolateral approach to the hip. With a #10 blade, a 15 cm  incision was made through the skin and subcutaneous tissue down to the level of the  IT band. Small bleeders were identified and cauterized. The IT band was cut in  line with skin incision exposing the greater trochanter. A Cobra retractor was placed between the gluteus minimus and the superior hip joint capsule, and a spiked Cobra between the quadratus femoris and the inferior hip joint capsule. This isolated the short  external rotators and piriformis tendons. These were tagged with a #2 Ethibond  suture and cut off their insertion on the intertrochanteric crest. The posterior  capsule was then developed into  an acetabular-based flap from Posterior Superior off of the acetabulum out over the femoral neck and back posterior inferior to the acetabular rim. This flap was tagged with two #2 Ethibond sutures and retracted protecting the sciatic nerve. This exposed the arthritic femoral head and osteophytes. The hip was then flexed and internally rotated, dislocating the femoral head and a standard neck cut performed 1/2 fingerbreadth above the lesser trochanter.  A spiked Cobra was placed in the cotyloid notch and a Hohmann retractor was then used to lever the femur anteriorly off of the anterior pelvic column. A posterior-inferior wing retractor was placed at the junction of the acetabulum and the ischium completing the acetabular exposure.We then removed the peripheral osteophytes and labrum from the acetabulum. We then  reamed the acetabulum up to 51 mm with basket reamers obtaining good coverage in all quadrants. We then irrigated with normal  saline solution and hammered into place a 52 mm pinnacle cup in 45  degrees of abduction and about 20 degrees of anteversion. More  peripheral osteophytes removed and a trial 10-degree liner placed with the  index superior-posterior. The hip was then flexed and internally rotated exposing the  proximal femur, which was entered with the initiating reamer followed by  the axial reamers up to a 13.5 mm full depth and 14mm partial depth. We then conically reamed to 18B to the correct depth for a 42 base neck. The calcar was milled to 18Bsm. A trial cone and stem was inserted in the 25 degrees anteversion, with a +0 36mm trial head. Trial reduction was then performed and excellent stability was noted with at 90 of flexion with 75 of internal rotation and then full extension with maximal external rotation. The hip could not be dislocated in full extension. The knee could easily flex  to about 130 degrees. We also stretched the abductors at this point,  because of the preexisting adductor contractures. All trial components  were then removed. The acetabulum was irrigated out with normal saline  solution. A titanium Apex [facility] Hospital[facility] Eliminator was then screwed into place  followed by a 10-degree polyethylene liner index superior-posterior. On  the femoral side a 18Bsm ZTT1 sleeve was hammered into place, followed by a 18x13x160x36 SROM stem in 25 degrees of anteversion. At this point, a +3 36 mm ceramic head was  hammered on the stem. The hip was reduced. We checked our stability  one more time and found it to be excellent. The wound was once again  thoroughly irrigated out with normal saline solution pulse lavage. The  capsular flap and short external rotators were repaired back to the  intertrochanteric crest through drill holes with a #2 Ethibond suture.  The IT band was closed with  running 1 Vicryl suture. The subcutaneous  tissue with 0 and 2-0 undyed Vicryl suture and the skin with running  interlocking 3-0 nylon suture. Dressing of Xeroform and Mepilex was  then applied. The patient was then unclamped, rolled supine, awaken extubated and taken to recovery room without difficulty in stable condition.   Druscilla Petsch J 10/07/2013, 11:12 AM

## 2013-10-07 NOTE — Progress Notes (Signed)
Tranexamic Acid for TKA- Telephone order from physician. No order in system.  Verified answers to questions below utilizing the patient's MEDICAL RECORD NUMBERPrevious or current DVT/PE- Not documented History of atherosclerotic CV disease (MI, ACS, CVA, PVOD, PVD)- Not documented Active malignancy- Not documented Acquired defective color vision-Not documented SAH-Not documented  Cody SnufferJessica Naquita Mcdonald, PharmD, BCPS Clinical Pharmacist [phone] 10/07/2013, 10:09 AM

## 2013-10-07 NOTE — Interval H&P Note (Signed)
History and Physical Interval Note:  10/07/2013 9:06 AM  Cody Mcdonald  has presented today for surgery, with the diagnosis of DEVELOPEMENTAL DYSPLASIA LEFT HIP  The various methods of treatment have been discussed with the patient and family. After consideration of risks, benefits and other options for treatment, the patient has consented to  Procedure(s): TOTAL HIP ARTHROPLASTY (Left) as a surgical intervention .  The patient's history has been reviewed, patient examined, no change in status, stable for surgery.  I have reviewed the patient's chart and labs.  Questions were answered to the patient's satisfaction.     Nestor LewandowskyOWAN,Jamarea Selner J

## 2013-10-07 NOTE — Progress Notes (Signed)
Utilization review completed.  

## 2013-10-08 LAB — BASIC METABOLIC PANEL
BUN: 14 mg/dL (ref 6–23)
CO2: 24 mEq/L (ref 19–32)
CREATININE: 1.13 mg/dL (ref 0.50–1.35)
Calcium: 8.6 mg/dL (ref 8.4–10.5)
Chloride: 99 mEq/L (ref 96–112)
GFR calc non Af Amer: 80 mL/min — ABNORMAL LOW (ref >90)
GLUCOSE: 123 mg/dL — AB (ref 70–99)
Potassium: 4 mEq/L (ref 3.7–5.3)
Sodium: 137 mEq/L (ref 137–147)

## 2013-10-08 LAB — CBC
HEMATOCRIT: 29.1 % — AB (ref 39.0–52.0)
Hemoglobin: 9.4 g/dL — ABNORMAL LOW (ref 13.0–17.0)
MCH: 21.1 pg — ABNORMAL LOW (ref 26.0–34.0)
MCHC: 32.3 g/dL (ref 30.0–36.0)
MCV: 65.4 fL — ABNORMAL LOW (ref 78.0–100.0)
Platelets: 132 10*3/uL — ABNORMAL LOW (ref 150–400)
RBC: 4.45 MIL/uL (ref 4.22–5.81)
RDW: 15.5 % (ref 11.5–15.5)
WBC: 8.5 10*3/uL (ref 4.0–10.5)

## 2013-10-08 NOTE — Evaluation (Addendum)
Occupational Therapy Evaluation Patient Details Name: Cody Mcdonald MRN: [id number] DOB: July 15, 1976 Today's Date: 10/08/2013 Time: [id number] OT Time Calculation (min): 25 min  OT Assessment / Plan / Recommendation History of present illness Pt is a 38 y/o male admitted with epiphyseal dysplasia of the L hip. Pt is now s/p L THA posterior approach, and plans to have the R done in 6 weeks.    Clinical Impression   Pt presents with below problem list. Pt independent with ADLs, PTA. Feel pt will benefit from acute OT to increase independence prior to d/c.     OT Assessment  Patient needs continued OT Services    Follow Up Recommendations  Home health OT;Supervision - Intermittent (when OOB/mobility)    Barriers to Discharge      Equipment Recommendations  3 in 1 bedside comode;Other (comment) (tub equipment tbd)    Recommendations for Other Services    Frequency  Min 2X/week    Precautions / Restrictions Precautions Precautions: Fall;Posterior Hip Precaution Booklet Issued: No Precaution Comments: Reviewed hip precautions Restrictions Weight Bearing Restrictions: Yes LLE Weight Bearing: Weight bearing as tolerated   Pertinent Vitals/Pain Pain 7.5/10 in LLE. Repositioned.     ADL  Upper Body Dressing: Set up;Supervision/safety Where Assessed - Upper Body Dressing: Supported sitting Lower Body Dressing: Moderate assistance Where Assessed - Lower Body Dressing: Supported sit to Pharmacist, hospitalstand Toilet Transfer: Moderate assistance Toilet Transfer Method: Sit to Baristastand Toilet Transfer Equipment:  (from chair) Toileting - ArchitectClothing Manipulation and Hygiene: Min guard Where Assessed - Engineer, miningToileting Clothing Manipulation and Hygiene: Standing Tub/Shower Transfer Method: Not assessed Equipment Used: Gait belt;Long-handled sponge;Reacher;Rolling walker;Long-handled shoe horn;Sock aid Transfers/Ambulation Related to ADLs: Min/Mod A for transfers. Min A for ambulation. ADL Comments: Educated on  tub DME options and various options for tub transfer techniques. Educated on AE for LB ADLs. Recommended sitting for bathing and dressing.    OT Diagnosis: Acute pain  OT Problem List: Decreased strength;Decreased activity tolerance;Impaired balance (sitting and/or standing);Decreased knowledge of use of DME or AE;Decreased knowledge of precautions;Pain;Decreased range of motion OT Treatment Interventions: Self-care/ADL training;DME and/or AE instruction;Therapeutic activities;Patient/family education;Balance training   OT Goals(Current goals can be found in the care plan section) Acute Rehab OT Goals Patient Stated Goal: not stated OT Goal Formulation: With patient Time For Goal Achievement: 10/15/13 Potential to Achieve Goals: Good ADL Goals Pt Will Perform Lower Body Bathing: with supervision;with adaptive equipment;sit to/from stand Pt Will Perform Lower Body Dressing: with supervision;with adaptive equipment;sit to/from stand Pt Will Transfer to Toilet: with supervision;ambulating (3 in 1 over commode) Pt Will Perform Toileting - Clothing Manipulation and hygiene: sit to/from stand;with modified independence Pt Will Perform Tub/Shower Transfer: Tub transfer;with supervision;ambulating;rolling walker (tub equipment tbd) Additional ADL Goal #1: Pt will independently verbalize and demonstrate 3/3 hip precautions.  Visit Information  Last OT Received On: 10/08/13 Assistance Needed: +2 (for increased ambulation) History of Present Illness: Pt is a 38 y/o male admitted with epiphyseal dysplasia of the L hip. Pt is now s/p L THA posterior approach, and plans to have the R done in 6 weeks.        Prior Functioning     Home Living Family/patient expects to be discharged to:: Private residence Living Arrangements: Parent Available Help at Discharge: Family;Available 24 hours/day Type of Home: House Home Access: Stairs to enter Entergy CorporationEntrance Stairs-Number of Steps: curb and 2 deep steps to  get to door. Threshold step to enter. Entrance Stairs-Rails: None Home Layout: Two level;1/2 bath on main level  Alternate Level Stairs-Number of Steps: Flight Alternate Level Stairs-Rails: Can reach both Home Equipment: Crutches Prior Function Level of Independence: Independent Comments: Still working Musician: No difficulties Dominant Hand: Right         Vision/Perception     Cognition  Cognition Arousal/Alertness: Awake/alert Behavior During Therapy: WFL for tasks assessed/performed Overall Cognitive Status: Within Functional Limits for tasks assessed    Extremity/Trunk Assessment Upper Extremity Assessment Upper Extremity Assessment: Overall WFL for tasks assessed Lower Extremity Assessment Lower Extremity Assessment: Defer to PT evaluation     Mobility Bed Mobility Overal bed mobility: Needs Assistance Bed Mobility: Sit to Supine Sit to supine: Min assist General bed mobility comments: Assistance with LE's.  Transfers Overall transfer level: Needs assistance Equipment used: Rolling walker (2 wheeled) Transfers: Sit to/from Stand Sit to Stand: Mod assist;Min assist (Mod A for sit to stand and Min A for stand to sit transfer) General transfer comment: cues for positioning of LLE and technique.     Exercise     Balance     End of Session OT - End of Session Equipment Utilized During Treatment: Gait belt;Rolling walker Activity Tolerance: Patient limited by pain Patient left: in bed;with call bell/phone within reach;with family/visitor present  GO     Earlie Raveling OTR/L [phone]  10/08/2013, 5:02 PM

## 2013-10-08 NOTE — Care Management Note (Signed)
CARE MANAGEMENT NOTE 10/08/2013  Patient:  Cody Mcdonald,Cody Mcdonald   Account Number:  [id number]  Date Initiated:  10/08/2013  Documentation initiated by:  Vance PeperBRADY,Kamy Poinsett  Subjective/Objective Assessment:   38 yr old male s/p left total hip arthroplasty.     Action/Plan:   Case manager spoke with patient concerning home health and DME. Patient preoperatively setup with [facility], no changes. DME has been delivered. CM will follow to see if patient needs Hospital bed.   Anticipated DC Date:  10/09/2013   Anticipated DC Plan:  HOME W HOME HEALTH SERVICES      DC Planning Services  CM consult      [facility]  [facility] HEALTH  DURABLE MEDICAL EQUIPMENT   Choice offered to / List presented to:  Mcdonald-1 Patient   DME arranged  3-N-1  WALKER - ROLLING      DME agency  [employer]     HH arranged  HH-2 PT      HH agency  [facility]   Status of service:  In process, will continue to follow Medicare Important Message given?   (If response is "NO", the following Medicare IM given date fields will be blank) Date Medicare IM given:   Date Additional Medicare IM given:    Discharge Disposition:  HOME W HOME HEALTH SERVICES

## 2013-10-08 NOTE — Progress Notes (Signed)
Patient ID: Cody Mcdonald, male   DOB: Jan 02, 1976, 38 y.o.   MRN: [id number] PATIENT ID: Cody Mcdonald  MRN: [id number]  DOB/AGE:  Jan 02, 1976 / 38 y.o.  1 Day Post-Op Procedure(s) (LRB): TOTAL HIP ARTHROPLASTY (Left)    PROGRESS NOTE Subjective: Patient is alert, oriented,no Nausea, no Vomiting, yes passing gas, no Bowel Movement. Taking PO well. Denies SOB, Chest or Calf Pain. Using Incentive Spirometer, PAS in place. Ambulate WBAT today Patient reports pain as 3 on 0-10 scale  .    Objective: Vital signs in last 24 hours: Filed Vitals:   10/07/13 1557 10/07/13 2008 10/08/13 0203 10/08/13 0540  BP:  125/68 141/68 122/59  Pulse:  83 91 81  Temp:  99.9 F (37.7 C) 98.7 F (37.1 C) 100.2 F (37.9 C)  TempSrc:  Oral Oral Oral  Resp: 18 18 18 19   SpO2: 99% 99% 99% 98%      Intake/Output from previous day: I/O last 3 completed shifts: In: 1560 [P.O.:360; I.V.:1200] Out: 1550 [Urine:1500; Blood:50]   Intake/Output this shift:     LABORATORY DATA:  Recent Labs  10/08/13 0453  WBC 8.5  HGB 9.4*  HCT 29.1*  PLT 132*  NA 137  K 4.0  CL 99  CO2 24  BUN 14  CREATININE 1.13  GLUCOSE 123*  CALCIUM 8.6    Examination: Neurologically intact ABD soft Neurovascular intact Sensation intact distally Intact pulses distally Dorsiflexion/Plantar flexion intact Incision: scant drainage No cellulitis present Compartment soft} XR AP&Lat of hip shows well placed\fixed THA  Assessment:   1 Day Post-Op Procedure(s) (LRB): TOTAL HIP ARTHROPLASTY (Left) ADDITIONAL DIAGNOSIS:  Thalassemia  Plan: PT/OT WBAT, THA  posterior precautions  DVT Prophylaxis: SCDx72 hrs, ASA 325 mg BID x 2 weeks  DISCHARGE PLAN: Home, when passes PT, lives with father  DISCHARGE NEEDS: HHPT, HHRN, CPM, Walker and 3-in-1 comode seat

## 2013-10-08 NOTE — Discharge Summary (Signed)
Patient ID: Cody Mcdonald MRN: [id number] DOB/AGE: February 22, 1976 38 y.o.  Admit date: 10/07/2013 Discharge date: 10/08/2013  Admission Diagnoses:  Active Problems:   Dysplasia of hip   Discharge Diagnoses:  Same  Past Medical History  Diagnosis Date  . Hypothyroidism   . Anemia     thalassemia  . GERD (gastroesophageal reflux disease)   . Hip dysplasia, congenital     Surgeries: Procedure(s): TOTAL HIP ARTHROPLASTY on 10/07/2013   Consultants:    Discharged Condition: Improved  Hospital Course: Cody Mcdonald is an 38 y.o. male who was admitted 10/07/2013 for operative treatment of epiphyseal dysplasia of the left hip. Patient has severe unremitting pain that affects sleep, daily activities, and work/hobbies. After pre-op clearance the patient was taken to the operating room on 10/07/2013 and underwent  Procedure(s): TOTAL HIP ARTHROPLASTY.    Patient was given perioperative antibiotics: Anti-infectives   Start     Dose/Rate Route Frequency Ordered Stop   10/07/13 0600  ceFAZolin (ANCEF) IVPB 2 g/50 mL premix     2 g 100 mL/hr over 30 Minutes Intravenous On call to O.R. 10/06/13 1426 10/07/13 1005       Patient was given sequential compression devices, early ambulation, and chemoprophylaxis to prevent DVT.  Patient benefited maximally from hospital stay and there were no complications.  Patient is discharged home after meeting all physical therapy goals  Recent vital signs: Patient Vitals for the past 24 hrs:  BP Temp Temp src Pulse Resp SpO2  10/08/13 0540 122/59 mmHg 100.2 F (37.9 C) Oral 81 19 98 %  10/08/13 0203 141/68 mmHg 98.7 F (37.1 C) Oral 91 18 99 %  10/07/13 2008 125/68 mmHg 99.9 F (37.7 C) Oral 83 18 99 %  10/07/13 1557 - - - - 18 99 %  10/07/13 1400 138/67 mmHg 98.4 F (36.9 C) - 82 16 100 %  10/07/13 1330 113/63 mmHg 98.4 F (36.9 C) - 69 11 100 %  10/07/13 1315 114/62 mmHg - - 63 14 100 %  10/07/13 1300 119/60 mmHg - - 57 9 84 %  10/07/13  1245 127/78 mmHg - - 59 9 100 %  10/07/13 1230 124/63 mmHg - - 67 11 100 %  10/07/13 1215 127/69 mmHg - - 65 10 100 %  10/07/13 1200 133/71 mmHg - - 65 9 100 %  10/07/13 1158 - 97.8 F (36.6 C) - 67 10 100 %  10/07/13 0835 138/63 mmHg 98.4 F (36.9 C) Oral 56 - 100 %     Recent laboratory studies:  Recent Labs  10/08/13 0453  WBC 8.5  HGB 9.4*  HCT 29.1*  PLT 132*  NA 137  K 4.0  CL 99  CO2 24  BUN 14  CREATININE 1.13  GLUCOSE 123*  CALCIUM 8.6     Discharge Medications:     Medication List    STOP taking these medications       HYDROcodone-acetaminophen 5-325 MG per tablet  Commonly known as:  NORCO/VICODIN      TAKE these medications       ANDROGEL TD  Place 1 application onto the skin daily.     aspirin EC 325 MG tablet  Take 1 tablet (325 mg total) by mouth 2 (two) times daily.     levothyroxine 50 MCG tablet  Commonly known as:  SYNTHROID, LEVOTHROID  Take 50 mcg by mouth daily before breakfast.     methocarbamol 500 MG tablet  Commonly known as:  ROBAXIN  Take 1 tablet (500 mg total) by mouth 2 (two) times daily with a meal.     multivitamin with minerals tablet  Take 1 tablet by mouth daily.     oxyCODONE-acetaminophen 5-325 MG per tablet  Commonly known as:  ROXICET  Take 1 tablet by mouth every 4 (four) hours as needed.        Diagnostic Studies: Dg Chest 2 View  10/02/2013   CLINICAL DATA:  Preoperative left hip replacement  EXAM: CHEST  2 VIEW  COMPARISON:  Chest CT August 17, 2006  FINDINGS: The lungs are clear. Heart size and pulmonary vascularity are normal. No adenopathy. No bone lesions.  IMPRESSION: No abnormality noted.   Electronically Signed   By: Bretta BangWilliam  Woodruff M.D.   On: 10/02/2013 16:32   Dg Pelvis Portable  10/07/2013   CLINICAL DATA:  Left hip arthroplasty.  EXAM: PORTABLE PELVIS 1-2 VIEWS  COMPARISON:  None.  FINDINGS: The femoral and acetabular components are well seated. No complicating features are demonstrated.   IMPRESSION: Well seated components of a total left hip arthroplasty.   Electronically Signed   By: Loralie ChampagneMark  Gallerani M.D.   On: 10/07/2013 14:00    Disposition: Final discharge disposition not confirmed      Discharge Orders   Future Orders Complete By Expires   Call MD / Call 911  As directed    Comments:     If you experience chest pain or shortness of breath, CALL 911 and be transported to the hospital emergency room.  If you develope a fever above 101 F, pus (white drainage) or increased drainage or redness at the wound, or calf pain, call your surgeon's office.   Change dressing  As directed    Comments:     You may change your dressing on POD #5   Constipation Prevention  As directed    Comments:     Drink plenty of fluids.  Prune juice may be helpful.  You may use a stool softener, such as Colace (over the counter) 100 mg twice a day.  Use MiraLax (over the counter) for constipation as needed.   Diet - low sodium heart healthy  As directed    Driving restrictions  As directed    Comments:     No driving for 2 weeks   Follow the hip precautions as taught in Physical Therapy  As directed    Increase activity slowly as tolerated  As directed    Patient may shower  As directed    Comments:     You may shower without a dressing once there is no drainage.  Do not wash over the wound.  If drainage remains, cover wound with plastic wrap and then shower.      Follow-up Information   Follow up with Nestor LewandowskyOWAN,Freman Lapage J, MD In 2 weeks.   Specialty:  Orthopedic Surgery   Contact information:   [address] [place] [address] [phone]       Follow up with Nestor LewandowskyOWAN,Shalana Jardin J, MD.   Specialty:  Orthopedic Surgery   Contact information:   [address] [place] [address] [phone]        Signed: Nestor LewandowskyROWAN,Claxton Levitz J 10/08/2013, 7:15 AM

## 2013-10-08 NOTE — Evaluation (Signed)
Physical Therapy Evaluation Patient Details Name: Cody Mcdonald MRN: [id number] DOB: 05/24/1976 Today's Date: 10/08/2013 Time: [id number] PT Time Calculation (min): 34 min  PT Assessment / Plan / Recommendation History of Present Illness  Pt is a 38 y/o male admitted with epiphyseal dysplasia of the L hip. Pt is now s/p L THA posterior approach, and plans to have the R done in 6 weeks.   Clinical Impression  This patient presents with acute pain and decreased functional independence following the above mentioned procedure. At the time of PT eval, pt was in 8/10 pain and had difficulty initiating functional movement due to pain. Increased assist required to achieve transfer to standing, however once up, pt was able to ambulate well with most of his weight through UE's. This patient is appropriate for skilled PT interventions to address functional limitations, improve safety and independence with functional mobility, and return to PLOF.    PT Assessment  Patient needs continued PT services    Follow Up Recommendations  Home health PT    Does the patient have the potential to tolerate intense rehabilitation      Barriers to Discharge Inaccessible home environment Bedroom and full bath up one flight of steps    Equipment Recommendations  Rolling walker with 5" wheels;3in1 (PT)    Recommendations for Other Services     Frequency 7X/week    Precautions / Restrictions Precautions Precautions: Fall;Posterior Hip Precaution Comments: Discussed 3/3 hip precautions with pt and father.  Restrictions Weight Bearing Restrictions: Yes LLE Weight Bearing: Weight bearing as tolerated   Pertinent Vitals/Pain 8/10 before, during, after session. Pt reports his pain has been poorly controlled, and RN was notified.       Mobility  Bed Mobility Overal bed mobility: +2 for physical assistance;Needs Assistance Bed Mobility: Supine to Sit Supine to sit: Mod assist;+2 for physical  assistance General bed mobility comments: VC's for hand placement and technique. Pt unable to assist much with LE movement (R or L), and bed pad was used to assist pt to transition to EOB.  Transfers Overall transfer level: Needs assistance Equipment used: Rolling walker (2 wheeled) Transfers: Sit to/from Stand Sit to Stand: Min assist;+2 physical assistance General transfer comment: VC's for hand placement on seated surface. Pt stood from elevated bed height, and required assist to steady upon achieving full upright position.  Ambulation/Gait Ambulation/Gait assistance: Min guard Ambulation Distance (Feet): 12 Feet Assistive device: Rolling walker (2 wheeled) Gait Pattern/deviations: Step-to pattern;Decreased stride length;Trunk flexed Gait velocity: Very slow Gait velocity interpretation: Below normal speed for age/gender General Gait Details: Decreased ability to weight bear through LLE, with UE's carrying most of weight on walker. Pt cued for sequencing and increased quad activation/heel down when stepping.     Exercises Total Joint Exercises Ankle Circles/Pumps: 10 reps Quad Sets: 10 reps Towel Squeeze: 10 reps Long Arc Quad: 10 reps   PT Diagnosis: Difficulty walking;Acute pain  PT Problem List: Decreased strength;Decreased range of motion;Decreased activity tolerance;Decreased balance;Decreased mobility;Decreased knowledge of use of DME;Decreased safety awareness;Decreased knowledge of precautions;Pain PT Treatment Interventions: DME instruction;Gait training;Stair training;Functional mobility training;Therapeutic activities;Therapeutic exercise;Neuromuscular re-education;Patient/family education     PT Goals(Current goals can be found in the care plan section) Acute Rehab PT Goals Patient Stated Goal: To heal and get stronger for next surgery in 6 weeks PT Goal Formulation: With patient/family Time For Goal Achievement: 10/22/13 Potential to Achieve Goals: Good  Visit  Information  Last PT Received On: 10/08/13 Assistance Needed: +2 History of Present  Illness: Pt is a 38 y/o male admitted with epiphyseal dysplasia of the L hip. Pt is now s/p L THA posterior approach, and plans to have the R done in 6 weeks.        Prior Functioning  Home Living Family/patient expects to be discharged to:: Private residence Living Arrangements: Parent Available Help at Discharge: Family;Available 24 hours/day Type of Home: House Home Access: Stairs to enter [employer] of Steps: curb and 2 deep steps to get to door. Threshold step to enter. Entrance Stairs-Rails: None Home Layout: Two level;1/2 bath on main level Alternate Level Stairs-Number of Steps: Flight Alternate Level Stairs-Rails: Can reach both Home Equipment: Crutches Prior Function Level of Independence: Independent Comments: Still working Musician: No difficulties Dominant Hand: Right    Cognition  Cognition Arousal/Alertness: Awake/alert Behavior During Therapy: WFL for tasks assessed/performed Overall Cognitive Status: Within Functional Limits for tasks assessed    Extremity/Trunk Assessment Upper Extremity Assessment Upper Extremity Assessment: Overall WFL for tasks assessed Lower Extremity Assessment Lower Extremity Assessment: Generalized weakness;LLE deficits/detail LLE Deficits / Details: Decreased strength and AROM consistent with THA LLE: Unable to fully assess due to pain Cervical / Trunk Assessment Cervical / Trunk Assessment: Normal   Balance Balance Overall balance assessment: Needs assistance Sitting-balance support: Feet supported;Bilateral upper extremity supported Sitting balance-Leahy Scale: Fair Sitting balance - Comments: Does not want to put weight through L hip, and holds self up with UE's to avoid the pain.  Postural control: Right lateral lean Standing balance support: Bilateral upper extremity supported Standing balance-Leahy Scale:  Fair  End of Session PT - End of Session Equipment Utilized During Treatment: Gait belt Activity Tolerance: Patient limited by pain Patient left: in chair;with call bell/phone within reach;with family/visitor present Nurse Communication: Mobility status  GP     [name] 10/08/2013, 12:36 PM  [name], PT, DPT [phone]

## 2013-10-08 NOTE — Progress Notes (Signed)
Physical Therapy Treatment Patient Details Name: Cody Mcdonald C Madia MRN: [id number] DOB: 06-27-1976 Today's Date: 10/08/2013 Time: [id number] PT Time Calculation (min): 36 min  PT Assessment / Plan / Recommendation  History of Present Illness Pt is a 38 y/o male admitted with epiphyseal dysplasia of the L hip. Pt is now s/p L THA posterior approach, and plans to have the R done in 6 weeks.    PT Comments   Pt continues to have increased pain throughout session, which is limiting his ability to weight bear through LLE, as well as sit with weight through the L hip in the recliner. Improvement shown with distance ambulated, as well as gait pattern/technique. Chair follow was utilized for safety.  Follow Up Recommendations  Home health PT     Does the patient have the potential to tolerate intense rehabilitation     Barriers to Discharge        Equipment Recommendations  Rolling walker with 5" wheels;3in1 (PT)    Recommendations for Other Services    Frequency 7X/week   Progress towards PT Goals Progress towards PT goals: Progressing toward goals  Plan Current plan remains appropriate    Precautions / Restrictions Precautions Precautions: Fall;Posterior Hip Precaution Booklet Issued: Yes (comment) Precaution Comments: Reviewed hip precautions with pt and father Restrictions Weight Bearing Restrictions: Yes LLE Weight Bearing: Weight bearing as tolerated   Pertinent Vitals/Pain 8/10 at rest, 9/10 during ambulation.     Mobility  Bed Mobility Overal bed mobility: Needs Assistance Bed Mobility: Sit to Supine Sit to supine: Min assist General bed mobility comments: NT - pt in chair when PT arrived Transfers Overall transfer level: Needs assistance Equipment used: Rolling walker (2 wheeled) Transfers: Sit to/from Stand Sit to Stand: Min assist General transfer comment: VC's for hand placement on seated surface prior to initiating transfers. Slow to come to full stand, but min A  only to achieve upright posture.  Ambulation/Gait Ambulation/Gait assistance: Min guard Ambulation Distance (Feet): 60 Feet Assistive device: Rolling walker (2 wheeled) Gait Pattern/deviations: Step-to pattern;Step-through pattern;Decreased stride length;Trunk flexed Gait velocity: Very slow Gait velocity interpretation: Below normal speed for age/gender General Gait Details: Pt cued for sequencing and safety awareness with the RW, as well as to try increasing weight bearing through LLE as he feels he can.    Exercises Total Joint Exercises Ankle Circles/Pumps: 10 reps Towel Squeeze: 10 reps Hip ABduction/ADduction: 10 reps Long Arc Quad: 10 reps   PT Diagnosis:    PT Problem List:   PT Treatment Interventions:     PT Goals (current goals can now be found in the care plan section) Acute Rehab PT Goals Patient Stated Goal: To heal and get stronger for next surgery in 6 weeks PT Goal Formulation: With patient/family Time For Goal Achievement: 10/22/13 Potential to Achieve Goals: Good  Visit Information  Last PT Received On: 10/08/13 Assistance Needed: +1 History of Present Illness: Pt is a 38 y/o male admitted with epiphyseal dysplasia of the L hip. Pt is now s/p L THA posterior approach, and plans to have the R done in 6 weeks.     Subjective Data  Subjective: "I got more pain medicine not long ago." Patient Stated Goal: To heal and get stronger for next surgery in 6 weeks   Cognition  Cognition Arousal/Alertness: Awake/alert Behavior During Therapy: WFL for tasks assessed/performed Overall Cognitive Status: Within Functional Limits for tasks assessed    Balance  Balance Overall balance assessment: Needs assistance Sitting-balance support: Feet supported;Bilateral upper  extremity supported Sitting balance-Leahy Scale: Fair Sitting balance - Comments: Does not want to put weight through L hip, and holds self up with UE's to avoid the pain.  Postural control: Right  lateral lean Standing balance support: Bilateral upper extremity supported Standing balance-Leahy Scale: Fair  End of Session PT - End of Session Equipment Utilized During Treatment: Gait belt Activity Tolerance: Patient limited by pain Patient left: in chair;with call bell/phone within reach;with family/visitor present Nurse Communication: Mobility status   GP     [name] 10/08/2013, 5:05 PM  [name], PT, DPT [phone]

## 2013-10-09 LAB — CBC
HEMATOCRIT: 28.7 % — AB (ref 39.0–52.0)
Hemoglobin: 9.3 g/dL — ABNORMAL LOW (ref 13.0–17.0)
MCH: 21.1 pg — AB (ref 26.0–34.0)
MCHC: 32.4 g/dL (ref 30.0–36.0)
MCV: 65.2 fL — ABNORMAL LOW (ref 78.0–100.0)
Platelets: 131 10*3/uL — ABNORMAL LOW (ref 150–400)
RBC: 4.4 MIL/uL (ref 4.22–5.81)
RDW: 15.5 % (ref 11.5–15.5)
WBC: 8.9 10*3/uL (ref 4.0–10.5)

## 2013-10-09 NOTE — Progress Notes (Signed)
Physical Therapy Treatment Patient Details Name: Cody Mcdonald MRN: [id number] DOB: 30-Mar-1976 Today's Date: 10/09/2013 Time: 4098-1191 PT Time Calculation (min): 37 min  PT Assessment / Plan / Recommendation  History of Present Illness Pt is a 38 y/o male admitted with epiphyseal dysplasia of the L hip. Pt is now s/p L THA posterior approach, and plans to have the R done in 6 weeks.    PT Comments   Pt progressing towards physical therapy goals, however is still limited by pain and stiffness with functional mobility. Pt moved very slowly with ambulation and transfers, however is steady and safe. Was able to negotiate 5 steps plus 2 curb steps during session, and states he feels comfortable returning home and entering house.  Follow Up Recommendations  Home health PT     Does the patient have the potential to tolerate intense rehabilitation     Barriers to Discharge        Equipment Recommendations  Rolling walker with 5" wheels;3in1 (PT)    Recommendations for Other Services    Frequency 7X/week   Progress towards PT Goals Progress towards PT goals: Progressing toward goals  Plan Current plan remains appropriate    Precautions / Restrictions Precautions Precautions: Fall;Posterior Hip Precaution Booklet Issued: Yes (comment) Precaution Comments: Reviewed hip precautions with pt and father Restrictions Weight Bearing Restrictions: Yes LLE Weight Bearing: Weight bearing as tolerated   Pertinent Vitals/Pain 8/10 during session, as pt was working with OT prior to PT session.     Mobility  Bed Mobility Supine to sit: Min assist General bed mobility comments: NT - pt received in ortho gym with OT Transfers Overall transfer level: Needs assistance Equipment used: Rolling walker (2 wheeled) Transfers: Sit to/from Stand Sit to Stand: Min guard General transfer comment: VC's for hand placement on seated surface prior to initiating transfers. Slow to come to full stand, but  no physical assist required.  Ambulation/Gait Ambulation/Gait assistance: Min guard Ambulation Distance (Feet): 75 Feet Assistive device: Rolling walker (2 wheeled) Gait Pattern/deviations: Step-to pattern;Step-through pattern;Decreased stride length;Trunk flexed Gait velocity: Very slow Gait velocity interpretation: Below normal speed for age/gender General Gait Details: Pt cued for sequencing and safety awareness with the RW, as well as to try increasing weight bearing through LLE as he feels he can. Stairs: Yes Stairs assistance: Min guard Stair Management: Two rails;No rails Number of Stairs: 7 (5 steps and 2 curb steps) General stair comments: VC's for sequencing and safety awareness.     Exercises Total Joint Exercises Ankle Circles/Pumps: 15 reps Quad Sets: 15 reps Hip ABduction/ADduction: 15 reps Long Arc Quad: 15 reps   PT Diagnosis:    PT Problem List:   PT Treatment Interventions:     PT Goals (current goals can now be found in the care plan section) Acute Rehab PT Goals Patient Stated Goal: To heal and get stronger for next surgery in 6 weeks PT Goal Formulation: With patient/family Time For Goal Achievement: 10/22/13 Potential to Achieve Goals: Good  Visit Information  Last PT Received On: 10/09/13 Assistance Needed: +1 History of Present Illness: Pt is a 38 y/o male admitted with epiphyseal dysplasia of the L hip. Pt is now s/p L THA posterior approach, and plans to have the R done in 6 weeks.     Subjective Data  Subjective: "It's stiff today." Patient Stated Goal: To heal and get stronger for next surgery in 6 weeks   Cognition  Cognition Arousal/Alertness: Awake/alert Behavior During Therapy: [facility] for tasks  assessed/performed Overall Cognitive Status: Within Functional Limits for tasks assessed    Balance  Balance Overall balance assessment: Needs assistance Sitting-balance support: Feet supported;Bilateral upper extremity supported Sitting  balance-Leahy Scale: Fair Sitting balance - Comments: Does not want to put weight through L hip, and holds self up with UE's to avoid the pain.  Postural control: Right lateral lean Standing balance support: Bilateral upper extremity supported Standing balance-Leahy Scale: Fair  End of Session PT - End of Session Equipment Utilized During Treatment: Gait belt Activity Tolerance: Patient limited by pain Patient left: in chair;with call bell/phone within reach;with family/visitor present Nurse Communication: Mobility status   GP     Ruthann CancerHamilton, Porschia Willbanks 10/09/2013, 1:09 PM  Ruthann CancerLaura Hamilton, PT, DPT [phone]

## 2013-10-09 NOTE — Discharge Summary (Signed)
Patient ID: Cody Mcdonald MRN: [id number] DOB/AGE: Nov 02, 1975 38 y.o.  Admit date: 10/07/2013 Discharge date: 10/09/2013  Admission Diagnoses:  Active Problems:   Dysplasia of hip   Discharge Diagnoses:  Same  Past Medical History  Diagnosis Date  . Hypothyroidism   . Anemia     thalassemia  . GERD (gastroesophageal reflux disease)   . Hip dysplasia, congenital     Surgeries: Procedure(s): TOTAL HIP ARTHROPLASTY on 10/07/2013   Consultants:    Discharged Condition: Improved  Hospital Course: THAO VANOVER is an 38 y.o. male who was admitted 10/07/2013 for operative treatment of<principal problem not specified>. Patient has severe unremitting pain that affects sleep, daily activities, and work/hobbies. After pre-op clearance the patient was taken to the operating room on 10/07/2013 and underwent  Procedure(s): TOTAL HIP ARTHROPLASTY.    Patient was given perioperative antibiotics: Anti-infectives   Start     Dose/Rate Route Frequency Ordered Stop   10/07/13 0600  ceFAZolin (ANCEF) IVPB 2 g/50 mL premix     2 g 100 mL/hr over 30 Minutes Intravenous On call to O.R. 10/06/13 1426 10/07/13 1005       Patient was given sequential compression devices, early ambulation, and chemoprophylaxis to prevent DVT.  Patient benefited maximally from hospital stay and there were no complications.    Recent vital signs: Patient Vitals for the past 24 hrs:  BP Temp Temp src Pulse Resp SpO2  10/09/13 0659 - 100 F (37.8 C) Oral - - -  10/09/13 0557 121/70 mmHg 100.4 F (38 C) Oral 97 20 98 %  10/09/13 0352 - - - - 18 94 %  10/09/13 0000 - - - - 20 93 %  10/08/13 2017 138/73 mmHg 98.8 F (37.1 C) Oral 98 20 91 %  10/08/13 2000 - - - - 20 93 %  10/08/13 1539 147/70 mmHg 98.7 F (37.1 C) - 96 20 98 %     Recent laboratory studies:  Recent Labs  10/08/13 0453 10/09/13 0355  WBC 8.5 8.9  HGB 9.4* 9.3*  HCT 29.1* 28.7*  PLT 132* 131*  NA 137  --   K 4.0  --   CL 99  --    CO2 24  --   BUN 14  --   CREATININE 1.13  --   GLUCOSE 123*  --   CALCIUM 8.6  --      Discharge Medications:     Medication List    STOP taking these medications       HYDROcodone-acetaminophen 5-325 MG per tablet  Commonly known as:  NORCO/VICODIN      TAKE these medications       ANDROGEL TD  Place 1 application onto the skin daily.     aspirin EC 325 MG tablet  Take 1 tablet (325 mg total) by mouth 2 (two) times daily.     levothyroxine 50 MCG tablet  Commonly known as:  SYNTHROID, LEVOTHROID  Take 50 mcg by mouth daily before breakfast.     methocarbamol 500 MG tablet  Commonly known as:  ROBAXIN  Take 1 tablet (500 mg total) by mouth 2 (two) times daily with a meal.     multivitamin with minerals tablet  Take 1 tablet by mouth daily.     oxyCODONE-acetaminophen 5-325 MG per tablet  Commonly known as:  ROXICET  Take 1 tablet by mouth every 4 (four) hours as needed.        Diagnostic Studies: Dg Chest 2 View  10/02/2013   CLINICAL DATA:  Preoperative left hip replacement  EXAM: CHEST  2 VIEW  COMPARISON:  Chest CT August 17, 2006  FINDINGS: The lungs are clear. Heart size and pulmonary vascularity are normal. No adenopathy. No bone lesions.  IMPRESSION: No abnormality noted.   Electronically Signed   By: Bretta BangWilliam  Woodruff M.D.   On: 10/02/2013 16:32   Dg Pelvis Portable  10/07/2013   CLINICAL DATA:  Left hip arthroplasty.  EXAM: PORTABLE PELVIS 1-2 VIEWS  COMPARISON:  None.  FINDINGS: The femoral and acetabular components are well seated. No complicating features are demonstrated.  IMPRESSION: Well seated components of a total left hip arthroplasty.   Electronically Signed   By: Loralie ChampagneMark  Gallerani M.D.   On: 10/07/2013 14:00    Disposition: Final discharge disposition not confirmed      Discharge Orders   Future Orders Complete By Expires   Call MD / Call 911  As directed    Comments:     If you experience chest pain or shortness of breath, CALL 911 and  be transported to the hospital emergency room.  If you develope a fever above 101 F, pus (white drainage) or increased drainage or redness at the wound, or calf pain, call your surgeon's office.   Change dressing  As directed    Comments:     You may change your dressing on POD #5   Constipation Prevention  As directed    Comments:     Drink plenty of fluids.  Prune juice may be helpful.  You may use a stool softener, such as Colace (over the counter) 100 mg twice a day.  Use MiraLax (over the counter) for constipation as needed.   Diet - low sodium heart healthy  As directed    Discharge instructions  As directed    Comments:     Follow up in office with Dr. Turner Danielsowan in 2 weeks.   Driving restrictions  As directed    Comments:     No driving for 2 weeks   Follow the hip precautions as taught in Physical Therapy  As directed    Increase activity slowly as tolerated  As directed    Patient may shower  As directed    Comments:     You may shower without a dressing once there is no drainage.  Do not wash over the wound.  If drainage remains, cover wound with plastic wrap and then shower.      Follow-up Information   Follow up with Nestor LewandowskyOWAN,FRANK J, MD In 2 weeks.   Specialty:  Orthopedic Surgery   Contact information:   [address] [place] [address] [phone]       Follow up with Nestor LewandowskyOWAN,FRANK J, MD.   Specialty:  Orthopedic Surgery   Contact information:   [address] [place] [address] [phone]        Signed: Vear ClockHILLIPS, Rozelia Catapano R 10/09/2013, 7:46 AM

## 2013-10-09 NOTE — Progress Notes (Signed)
Occupational Therapy Treatment Patient Details Name: Cody Mcdonald MRN: [id number] DOB: [birth year] Today's Date: 10/09/2013 Time: 4098-1191 OT Time Calculation (min): 45 min  OT Assessment / Plan / Recommendation  History of present illness Pt is a 38 y/o male admitted with epiphyseal dysplasia of the L hip. Pt is now s/p L THA posterior approach, and plans to have the R done in 6 weeks.    OT comments  Pt with pain and stiffness but did well during OT session.  Min cues for internal rotation during turns and with bed mobility  Father present for session  Follow Up Recommendations  Home health OT;Supervision - Intermittent    Barriers to Discharge       Equipment Recommendations  3 in 1 bedside comode    Recommendations for Other Services    Frequency Min 2X/week   Progress towards OT Goals Progress towards OT goals: Progressing toward goals  Plan      Precautions / Restrictions Precautions Precautions: Fall;Posterior Hip Precaution Booklet Issued: Yes (comment) Precaution Comments: pt verbalizes understanding of precautions:  reinforcement during bed mobility Restrictions LLE Weight Bearing: Weight bearing as tolerated   Pertinent Vitals/Pain LLE stiff and painful with movement.  Not rated.      ADL  Toilet Transfer: Supervision/safety Statistician Method: Sit to Barista: Animator Transfer: Min guard Tub/Shower Transfer Method: Science writer: Counsellor Used: Rolling walker Transfers/Ambulation Related to ADLs: pt ambulated to gym with supervision, min cues for step length.  ADL Comments: Educated on tub transfer bench and pt practiced, using leg lifter for LLE.  Pt does better when he can control movement---less painful.  Pt would have to use tub bench so that he would sit backwards in shower due to toilet being in the way.  Gave father resource list but they will have HH look  at tub to see if it would work.  Pt states he would be OK with sponge bathing, too. Educated that they can buy a cheap shower curtain liner and make slits to run this through partitions in tub bench to keep water in tub.  Pt does not have a hand held shower.  Reviewed AE with pt.  He practiced yesterday and feels like he will be fine with it.  Needed only one cue to avoid internal rotation during turns; needs light min physical assist with LLE during bed mobility to avoid internal rotation    OT Diagnosis:    OT Problem List:   OT Treatment Interventions:     OT Goals(current goals can now be found in the care plan section)    Visit Information  Last OT Received On: 10/09/13 Assistance Needed: +1 History of Present Illness: Pt is a 38 y/o male admitted with epiphyseal dysplasia of the L hip. Pt is now s/p L THA posterior approach, and plans to have the R done in 6 weeks.     Subjective Data      Prior Functioning       Cognition  Cognition Arousal/Alertness: Awake/alert Behavior During Therapy: WFL for tasks assessed/performed Overall Cognitive Status: Within Functional Limits for tasks assessed    Mobility  Bed Mobility Supine to sit: Min assist General bed mobility comments: assist for LLE Transfers Sit to Stand: Supervision General transfer comment: extra time and cues to extend LLE    Exercises      Balance    End of Session OT - End of Session Activity  Tolerance: Patient tolerated treatment well Patient left:  (handed off to PT in the gym)  GO     Cody Mcdonald 10/09/2013, 11:21 AM Cody Mcdonald, OTR/L [phone] 10/09/2013

## 2013-10-09 NOTE — Progress Notes (Signed)
PATIENT ID: Cody Mcdonald  MRN: [id number]  DOB/AGE:  [birth year] / 38 y.o.  2 Days Post-Op Procedure(s) (LRB): TOTAL HIP ARTHROPLASTY (Left)    PROGRESS NOTE Subjective: Patient is alert, oriented,no Nausea, no Vomiting, yes passing gas, no Bowel Movement. Taking PO well. Denies SOB, Chest or Calf Pain. Using Incentive Spirometer, PAS in place. Ambulate WBAT Patient reports pain as 6 on 0-10 scale  .    Objective: Vital signs in last 24 hours: Filed Vitals:   10/09/13 0000 10/09/13 0352 10/09/13 0557 10/09/13 0659  BP:   121/70   Pulse:   97   Temp:   100.4 F (38 C) 100 F (37.8 C)  TempSrc:   Oral Oral  Resp: 20 18 20    SpO2: 93% 94% 98%       Intake/Output from previous day: I/O last 3 completed shifts: In: 2460 [P.O.:960; I.V.:1500] Out: 4525 [Urine:4525]   Intake/Output this shift: Total I/O In: -  Out: 625 [Urine:625]   LABORATORY DATA:  Recent Labs  10/08/13 0453 10/09/13 0355  WBC 8.5 8.9  HGB 9.4* 9.3*  HCT 29.1* 28.7*  PLT 132* 131*  NA 137  --   K 4.0  --   CL 99  --   CO2 24  --   BUN 14  --   CREATININE 1.13  --   GLUCOSE 123*  --   CALCIUM 8.6  --     Examination: Neurologically intact Neurovascular intact Sensation intact distally Intact pulses distally Dorsiflexion/Plantar flexion intact Incision: dressing C/D/I and scant drainage No cellulitis present Compartment soft} XR AP&Lat of hip shows well placed\fixed THA  Assessment:   2 Days Post-Op Procedure(s) (LRB): TOTAL HIP ARTHROPLASTY (Left) ADDITIONAL DIAGNOSIS:  Thalassemia  Plan: PT/OT WBAT, THA  posterior precautions  DVT Prophylaxis: SCDx72 hrs, ASA 325 mg BID x 2 weeks  DISCHARGE PLAN: Home, plan to D/C home once pt meets PT goals  DISCHARGE NEEDS: HHPT, HHRN, Walker and 3-in-1 comode seat

## 2013-10-10 ENCOUNTER — Encounter (HOSPITAL_COMMUNITY): Payer: Self-pay | Admitting: Orthopedic Surgery

## 2013-10-28 ENCOUNTER — Ambulatory Visit: Payer: BC Managed Care – PPO | Attending: Orthopedic Surgery | Admitting: Physical Therapy

## 2013-10-28 DIAGNOSIS — R5381 Other malaise: Secondary | ICD-10-CM | POA: Diagnosis not present

## 2013-10-28 DIAGNOSIS — M25559 Pain in unspecified hip: Secondary | ICD-10-CM | POA: Insufficient documentation

## 2013-10-28 DIAGNOSIS — M25669 Stiffness of unspecified knee, not elsewhere classified: Secondary | ICD-10-CM | POA: Diagnosis not present

## 2013-10-28 DIAGNOSIS — IMO0001 Reserved for inherently not codable concepts without codable children: Secondary | ICD-10-CM | POA: Insufficient documentation

## 2013-10-29 ENCOUNTER — Ambulatory Visit: Payer: BC Managed Care – PPO | Admitting: Physical Therapy

## 2013-10-29 ENCOUNTER — Other Ambulatory Visit: Payer: Self-pay | Admitting: Orthopedic Surgery

## 2013-10-29 DIAGNOSIS — IMO0001 Reserved for inherently not codable concepts without codable children: Secondary | ICD-10-CM | POA: Diagnosis not present

## 2013-10-31 ENCOUNTER — Ambulatory Visit: Payer: BC Managed Care – PPO | Admitting: Physical Therapy

## 2013-10-31 DIAGNOSIS — IMO0001 Reserved for inherently not codable concepts without codable children: Secondary | ICD-10-CM | POA: Diagnosis not present

## 2013-11-04 ENCOUNTER — Ambulatory Visit: Payer: BC Managed Care – PPO | Admitting: Physical Therapy

## 2013-11-04 DIAGNOSIS — IMO0001 Reserved for inherently not codable concepts without codable children: Secondary | ICD-10-CM | POA: Diagnosis not present

## 2013-11-06 ENCOUNTER — Ambulatory Visit: Payer: BC Managed Care – PPO | Admitting: Physical Therapy

## 2013-11-06 DIAGNOSIS — IMO0001 Reserved for inherently not codable concepts without codable children: Secondary | ICD-10-CM | POA: Diagnosis not present

## 2013-11-07 ENCOUNTER — Encounter (HOSPITAL_COMMUNITY): Payer: Self-pay | Admitting: Pharmacy Technician

## 2013-11-07 ENCOUNTER — Ambulatory Visit: Payer: BC Managed Care – PPO | Admitting: Physical Therapy

## 2013-11-07 DIAGNOSIS — IMO0001 Reserved for inherently not codable concepts without codable children: Secondary | ICD-10-CM | POA: Diagnosis not present

## 2013-11-12 ENCOUNTER — Encounter (HOSPITAL_COMMUNITY): Payer: Self-pay

## 2013-11-12 ENCOUNTER — Encounter (HOSPITAL_COMMUNITY)
Admission: RE | Admit: 2013-11-12 | Discharge: 2013-11-12 | Disposition: A | Discharge status: Home / Self Care | Payer: BC Managed Care – PPO | Source: Ambulatory Visit | Attending: Orthopedic Surgery | Admitting: Orthopedic Surgery

## 2013-11-12 ENCOUNTER — Ambulatory Visit: Payer: BC Managed Care – PPO | Admitting: Physical Therapy

## 2013-11-12 DIAGNOSIS — IMO0001 Reserved for inherently not codable concepts without codable children: Secondary | ICD-10-CM | POA: Diagnosis not present

## 2013-11-12 DIAGNOSIS — Z01812 Encounter for preprocedural laboratory examination: Secondary | ICD-10-CM | POA: Insufficient documentation

## 2013-11-12 LAB — BASIC METABOLIC PANEL
BUN: 16 mg/dL (ref 6–23)
CO2: 28 mEq/L (ref 19–32)
Calcium: 9.8 mg/dL (ref 8.4–10.5)
Chloride: 101 mEq/L (ref 96–112)
Creatinine, Ser: 1.06 mg/dL (ref 0.50–1.35)
GFR calc non Af Amer: 87 mL/min — ABNORMAL LOW (ref >90)
Glucose, Bld: 79 mg/dL (ref 70–99)
POTASSIUM: 4.4 meq/L (ref 3.7–5.3)
SODIUM: 140 meq/L (ref 137–147)

## 2013-11-12 LAB — URINALYSIS, ROUTINE W REFLEX MICROSCOPIC
BILIRUBIN URINE: NEGATIVE
Glucose, UA: NEGATIVE mg/dL
Hgb urine dipstick: NEGATIVE
KETONES UR: NEGATIVE mg/dL
Leukocytes, UA: NEGATIVE
NITRITE: NEGATIVE
PH: 7.5 (ref 5.0–8.0)
Protein, ur: NEGATIVE mg/dL
Specific Gravity, Urine: 1.014 (ref 1.005–1.030)
Urobilinogen, UA: 1 mg/dL (ref 0.0–1.0)

## 2013-11-12 LAB — CBC WITH DIFFERENTIAL/PLATELET
BASOS PCT: 1 % (ref 0–1)
Basophils Absolute: 0 10*3/uL (ref 0.0–0.1)
EOS ABS: 0.1 10*3/uL (ref 0.0–0.7)
EOS PCT: 3 % (ref 0–5)
HCT: 32.4 % — ABNORMAL LOW (ref 39.0–52.0)
HEMOGLOBIN: 10.3 g/dL — AB (ref 13.0–17.0)
Lymphocytes Relative: 30 % (ref 12–46)
Lymphs Abs: 1.3 10*3/uL (ref 0.7–4.0)
MCH: 21.1 pg — ABNORMAL LOW (ref 26.0–34.0)
MCHC: 31.8 g/dL (ref 30.0–36.0)
MCV: 66.3 fL — ABNORMAL LOW (ref 78.0–100.0)
Monocytes Absolute: 0.5 10*3/uL (ref 0.1–1.0)
Monocytes Relative: 12 % (ref 3–12)
NEUTROS PCT: 54 % (ref 43–77)
Neutro Abs: 2.3 10*3/uL (ref 1.7–7.7)
Platelets: 168 10*3/uL (ref 150–400)
RBC: 4.89 MIL/uL (ref 4.22–5.81)
RDW: 15.3 % (ref 11.5–15.5)
WBC: 4.2 10*3/uL (ref 4.0–10.5)

## 2013-11-12 LAB — SURGICAL PCR SCREEN
MRSA, PCR: NEGATIVE
Staphylococcus aureus: NEGATIVE

## 2013-11-12 LAB — APTT: aPTT: 31 seconds (ref 24–37)

## 2013-11-12 LAB — PROTIME-INR
INR: 1.09 (ref 0.00–1.49)
PROTHROMBIN TIME: 13.9 s (ref 11.6–15.2)

## 2013-11-12 LAB — TYPE AND SCREEN
ABO/RH(D): O NEG
ANTIBODY SCREEN: NEGATIVE

## 2013-11-12 NOTE — Pre-Procedure Instructions (Signed)
Cody Mcdonald  11/12/2013   Your procedure is scheduled on:   Monday  11/18/13  [facility]  2 * 3 at 750 AM.  Call this number if you have problems the morning of surgery: [phone]   Remember:   Do not eat food or drink liquids after midnight.   Take these medicines the morning of surgery with A SIP OF WATER:  LEVOTHYROXINE, TRAMADOL IF NEEDED   Do not wear jewelry, make-up or nail polish.  Do not wear lotions, powders, or perfumes. You may wear deodorant.  Do not shave 48 hours prior to surgery. Men may shave face and neck.  Do not bring valuables to the hospital.  [facility] not [facility]                  for any belongings or valuables.               Contacts, dentures or bridgework may not be worn into surgery.  Leave suitcase in the car. After surgery it may be brought to your room.  For patients admitted to the hospital, discharge time is determined by your                treatment team.               Patients discharged the day of surgery will not be allowed to drive  home.  Name and phone number of your driver:   Special Instructions:  Special Instructions: [place] - Preparing for Surgery  Before surgery, you can play an important role.  Because skin is not sterile, your skin needs to be as free of germs as possible.  You can reduce the number of germs on you skin by washing with CHG (chlorahexidine gluconate) soap before surgery.  CHG is an antiseptic cleaner which kills germs and bonds with the skin to continue killing germs even after washing.  Please DO NOT use if you have an allergy to CHG or antibacterial soaps.  If your skin becomes reddened/irritated stop using the CHG and inform your nurse when you arrive at Short Stay.  Do not shave (including legs and underarms) for at least 48 hours prior to the first CHG shower.  You may shave your face.  Please follow these instructions carefully:   1.  Shower with CHG Soap the  night before surgery and the morning of Surgery.  2.  If you choose to wash your hair, wash your hair first as usual with your normal shampoo.  3.  After you shampoo, rinse your hair and body thoroughly to remove the Shampoo.  4.  Use CHG as you would any other liquid soap. You can apply chg directly to the skin and wash gently with scrungie or a clean washcloth.  5.  Apply the CHG Soap to your body ONLY FROM THE NECK DOWN.  Do not use on open wounds or open sores.  Avoid contact with your eyes, ears, mouth and genitals (private parts).  Wash genitals (private parts with your normal soap.  6.  Wash thoroughly, paying special attention to the area where your surgery will be performed.  7.  Thoroughly rinse your body with warm water from the neck down.  8.  DO NOT shower/wash with your normal soap after using and rinsing off the CHG Soap.  9.  Pat yourself dry with a clean towel.  10.  Wear clean pajamas.            11.  Place clean sheets on your bed the night of your first shower and do not sleep with pets.  Day of Surgery  Do not apply any lotions/deodorants the morning of surgery.  Please wear clean clothes to the hospital/surgery center.   Please read over the following fact sheets that you were given: Pain Booklet, Coughing and Deep Breathing, Blood Transfusion Information, Total Joint Packet, MRSA Information and Surgical Site Infection Prevention

## 2013-11-13 ENCOUNTER — Ambulatory Visit: Payer: BC Managed Care – PPO | Admitting: Physical Therapy

## 2013-11-13 DIAGNOSIS — IMO0001 Reserved for inherently not codable concepts without codable children: Secondary | ICD-10-CM | POA: Diagnosis not present

## 2013-11-15 NOTE — H&P (Signed)
TOTAL HIP ADMISSION H&P  Patient is admitted for right total hip arthroplasty.  Subjective:  Chief Complaint: right hip pain  HPI: Janelle Floor, 38 y.o. male, has a history of pain and functional disability in the right hip(s) due to arthritis and patient has failed non-surgical conservative treatments for greater than 12 weeks to include NSAID's and/or analgesics, flexibility and strengthening excercises, use of assistive devices and activity modification.  Onset of symptoms was gradual starting >10 years ago with gradually worsening course since that time.The patient noted no past surgery on the right hip(s).  Patient currently rates pain in the right hip at 10 out of 10 with activity. Patient has night pain, worsening of pain with activity and weight bearing, trendelenberg gait, pain that interfers with activities of daily living, pain with passive range of motion and crepitus. Patient has evidence of hip dysplasia by imaging studies. This condition presents safety issues increasing the risk of falls.  There is no current active infection.  Patient Active Problem List   Diagnosis Date Noted  . Dysplasia of hip 10/07/2013   Past Medical History  Diagnosis Date  . Hypothyroidism   . GERD (gastroesophageal reflux disease)   . Hip dysplasia, congenital   . Anemia     thalassemia    Past Surgical History  Procedure Laterality Date  . Wisdom tooth extraction  12/2010  . Total hip arthroplasty Left 10/07/2013    DR Turner Daniels  . Total hip arthroplasty Left 10/07/2013    Procedure: TOTAL HIP ARTHROPLASTY;  Surgeon: Nestor Lewandowsky, MD;  Location: [facility] OR;  Service: Orthopedics;  Laterality: Left;    No prescriptions prior to admission   No Known Allergies  History  Substance Use Topics  . Smoking status: Never Smoker   . Smokeless tobacco: Former Neurosurgeon    Types: Chew  . Alcohol Use: No    No family history on file.   Review of Systems  Constitutional: Negative.   HENT: Negative.    Eyes: Negative.   Respiratory: Negative.   Cardiovascular: Negative.   Gastrointestinal: Negative.   Genitourinary: Negative.   Musculoskeletal: Positive for joint pain.  Skin: Negative.   Neurological: Negative.   Endo/Heme/Allergies: Negative.   Psychiatric/Behavioral: Negative.     Objective:  Physical Exam  Constitutional: He is oriented to person, place, and time. He appears well-developed and well-nourished.  HENT:  Head: Normocephalic and atraumatic.  Eyes: Pupils are equal, round, and reactive to light.  Neck: Normal range of motion. Neck supple.  Cardiovascular: Intact distal pulses.   Respiratory: Effort normal.  Musculoskeletal:  Internal next rotation of both hips is around 15, foot tap is negative, flexion is to about 110, 10 flexion contractures bilaterally.    Neurological: He is alert and oriented to person, place, and time.  Skin: Skin is warm and dry.  Psychiatric: He has a normal mood and affect. His behavior is normal. Judgment and thought content normal.    Vital signs in last 24 hours:    Labs:   Estimated body mass index is 21.58 kg/(m^2) as calculated from the following:   Height as of 10/02/13: 5' 9.5" (1.765 m).   Weight as of 10/02/13: 67.223 kg (148 lb 3.2 oz).   Imaging Review X-rays reviewed  show impressive dysplasia with flattening of the femoral heads and dysplastic acetabuli.  Articular cartilage height is partially maintained throughout.  He has a high neck shaft angle on the left, less so on the right.  Assessment/Plan:  End stage arthritis, right hip(s) with impressive hip dysplasia  The patient history, physical examination, clinical judgement of the provider and imaging studies are consistent with end stage degenerative joint disease of the right hip(s) and total hip arthroplasty is deemed medically necessary. The treatment options including medical management, injection therapy, arthroscopy and arthroplasty were discussed at  length. The risks and benefits of total hip arthroplasty were presented and reviewed. The risks due to aseptic loosening, infection, stiffness, dislocation/subluxation,  thromboembolic complications and other imponderables were discussed.  The patient acknowledged the explanation, agreed to proceed with the plan and consent was signed. Patient is being admitted for inpatient treatment for surgery, pain control, PT, OT, prophylactic antibiotics, VTE prophylaxis, progressive ambulation and ADL's and discharge planning.The patient is planning to be discharged home with home health services

## 2013-11-17 MED ORDER — CEFAZOLIN SODIUM-DEXTROSE 2-3 GM-% IV SOLR
2.0000 g | INTRAVENOUS | Status: AC
  Administered 2013-11-18: 2 g via INTRAVENOUS
  Filled 2013-11-17: qty 50

## 2013-11-18 ENCOUNTER — Inpatient Hospital Stay (HOSPITAL_COMMUNITY)
Admission: RE | Admit: 2013-11-18 | Discharge: 2013-11-20 | DRG: 470 | Disposition: A | Discharge status: Home Health | Payer: BC Managed Care – PPO | Source: Ambulatory Visit | Attending: Orthopedic Surgery | Admitting: Orthopedic Surgery

## 2013-11-18 ENCOUNTER — Encounter (HOSPITAL_COMMUNITY): Payer: BC Managed Care – PPO | Admitting: Anesthesiology

## 2013-11-18 ENCOUNTER — Encounter (HOSPITAL_COMMUNITY): Payer: Self-pay | Admitting: *Deleted

## 2013-11-18 ENCOUNTER — Encounter (HOSPITAL_COMMUNITY)
Admission: RE | Disposition: A | Discharge status: Home Health | Payer: Self-pay | Source: Ambulatory Visit | Attending: Orthopedic Surgery

## 2013-11-18 ENCOUNTER — Inpatient Hospital Stay (HOSPITAL_COMMUNITY): Payer: BC Managed Care – PPO

## 2013-11-18 ENCOUNTER — Ambulatory Visit (HOSPITAL_COMMUNITY): Payer: BC Managed Care – PPO | Admitting: Anesthesiology

## 2013-11-18 DIAGNOSIS — M1610 Unilateral primary osteoarthritis, unspecified hip: Principal | ICD-10-CM | POA: Diagnosis present

## 2013-11-18 DIAGNOSIS — D649 Anemia, unspecified: Secondary | ICD-10-CM | POA: Diagnosis present

## 2013-11-18 DIAGNOSIS — Z96649 Presence of unspecified artificial hip joint: Secondary | ICD-10-CM

## 2013-11-18 DIAGNOSIS — D569 Thalassemia, unspecified: Secondary | ICD-10-CM | POA: Diagnosis present

## 2013-11-18 DIAGNOSIS — Q6589 Other specified congenital deformities of hip: Secondary | ICD-10-CM

## 2013-11-18 DIAGNOSIS — E039 Hypothyroidism, unspecified: Secondary | ICD-10-CM | POA: Diagnosis present

## 2013-11-18 DIAGNOSIS — M169 Osteoarthritis of hip, unspecified: Principal | ICD-10-CM | POA: Diagnosis present

## 2013-11-18 DIAGNOSIS — Z87891 Personal history of nicotine dependence: Secondary | ICD-10-CM

## 2013-11-18 DIAGNOSIS — K219 Gastro-esophageal reflux disease without esophagitis: Secondary | ICD-10-CM | POA: Diagnosis present

## 2013-11-18 HISTORY — PX: TOTAL HIP ARTHROPLASTY: SHX124

## 2013-11-18 SURGERY — ARTHROPLASTY, HIP, TOTAL,POSTERIOR APPROACH
Anesthesia: General | Site: Hip | Laterality: Right

## 2013-11-18 MED ORDER — DIPHENHYDRAMINE HCL 12.5 MG/5ML PO ELIX: 12.5000 mg | ORAL_SOLUTION | ORAL | Status: DC | PRN

## 2013-11-18 MED ORDER — EPHEDRINE SULFATE 50 MG/ML IJ SOLN
INTRAMUSCULAR | Status: AC
  Filled 2013-11-18: qty 1

## 2013-11-18 MED ORDER — LIDOCAINE HCL (CARDIAC) 20 MG/ML IV SOLN
INTRAVENOUS | Status: AC
  Filled 2013-11-18: qty 5

## 2013-11-18 MED ORDER — HYDROMORPHONE HCL PF 1 MG/ML IJ SOLN
INTRAMUSCULAR | Status: AC
  Filled 2013-11-18: qty 1

## 2013-11-18 MED ORDER — BUPIVACAINE-EPINEPHRINE 0.5% -1:200000 IJ SOLN
INTRAMUSCULAR | Status: DC | PRN
  Administered 2013-11-18: 20 mL

## 2013-11-18 MED ORDER — METOCLOPRAMIDE HCL 5 MG/ML IJ SOLN: 5.0000 mg | Freq: Three times a day (TID) | INTRAMUSCULAR | Status: DC | PRN

## 2013-11-18 MED ORDER — BISACODYL 5 MG PO TBEC: 5.0000 mg | DELAYED_RELEASE_TABLET | Freq: Every day | ORAL | Status: DC | PRN

## 2013-11-18 MED ORDER — LACTATED RINGERS IV SOLN
INTRAVENOUS | Status: DC
  Administered 2013-11-18: 08:00:00 via INTRAVENOUS

## 2013-11-18 MED ORDER — ASPIRIN EC 325 MG PO TBEC
325.0000 mg | DELAYED_RELEASE_TABLET | Freq: Every day | ORAL | Status: DC
  Administered 2013-11-19 – 2013-11-20 (×2): 325 mg via ORAL
  Filled 2013-11-18 (×3): qty 1

## 2013-11-18 MED ORDER — SODIUM CHLORIDE 0.9 % IJ SOLN
INTRAMUSCULAR | Status: AC
  Filled 2013-11-18: qty 10

## 2013-11-18 MED ORDER — METHOCARBAMOL 500 MG PO TABS: 500.0000 mg | ORAL_TABLET | Freq: Two times a day (BID) | ORAL | Status: AC

## 2013-11-18 MED ORDER — BUPIVACAINE-EPINEPHRINE (PF) 0.5% -1:200000 IJ SOLN
INTRAMUSCULAR | Status: AC
  Filled 2013-11-18: qty 10

## 2013-11-18 MED ORDER — ASPIRIN EC 325 MG PO TBEC: 325.0000 mg | DELAYED_RELEASE_TABLET | Freq: Two times a day (BID) | ORAL | Status: DC

## 2013-11-18 MED ORDER — ONDANSETRON HCL 4 MG/2ML IJ SOLN
INTRAMUSCULAR | Status: DC | PRN
  Administered 2013-11-18: 4 mg via INTRAVENOUS

## 2013-11-18 MED ORDER — PROPOFOL 10 MG/ML IV BOLUS
INTRAVENOUS | Status: AC
  Filled 2013-11-18: qty 20

## 2013-11-18 MED ORDER — FENTANYL CITRATE 0.05 MG/ML IJ SOLN
INTRAMUSCULAR | Status: DC | PRN
  Administered 2013-11-18 (×3): 50 ug via INTRAVENOUS
  Administered 2013-11-18: 100 ug via INTRAVENOUS

## 2013-11-18 MED ORDER — ROCURONIUM BROMIDE 50 MG/5ML IV SOLN
INTRAVENOUS | Status: AC
  Filled 2013-11-18: qty 1

## 2013-11-18 MED ORDER — ACETAMINOPHEN 325 MG PO TABS
650.0000 mg | ORAL_TABLET | Freq: Four times a day (QID) | ORAL | Status: DC | PRN
  Administered 2013-11-19: 650 mg via ORAL
  Filled 2013-11-18: qty 2

## 2013-11-18 MED ORDER — LIDOCAINE HCL (CARDIAC) 20 MG/ML IV SOLN
INTRAVENOUS | Status: DC | PRN
  Administered 2013-11-18: 90 mg via INTRAVENOUS

## 2013-11-18 MED ORDER — LACTATED RINGERS IV SOLN
INTRAVENOUS | Status: DC | PRN
  Administered 2013-11-18 (×3): via INTRAVENOUS

## 2013-11-18 MED ORDER — KCL IN DEXTROSE-NACL 20-5-0.45 MEQ/L-%-% IV SOLN
INTRAVENOUS | Status: DC
  Administered 2013-11-18: 16:00:00 via INTRAVENOUS
  Filled 2013-11-18 (×8): qty 1000

## 2013-11-18 MED ORDER — METHOCARBAMOL 500 MG PO TABS
500.0000 mg | ORAL_TABLET | Freq: Four times a day (QID) | ORAL | Status: DC | PRN
  Administered 2013-11-18 – 2013-11-20 (×6): 500 mg via ORAL
  Filled 2013-11-18 (×6): qty 1

## 2013-11-18 MED ORDER — GLYCOPYRROLATE 0.2 MG/ML IJ SOLN
INTRAMUSCULAR | Status: AC
  Filled 2013-11-18: qty 3

## 2013-11-18 MED ORDER — NEOSTIGMINE METHYLSULFATE 1 MG/ML IJ SOLN
INTRAMUSCULAR | Status: DC | PRN
  Administered 2013-11-18: 4 mg via INTRAVENOUS

## 2013-11-18 MED ORDER — CHLORHEXIDINE GLUCONATE 4 % EX LIQD: 1.0000 "application " | Freq: Once | CUTANEOUS | Status: DC

## 2013-11-18 MED ORDER — DOCUSATE SODIUM 100 MG PO CAPS
100.0000 mg | ORAL_CAPSULE | Freq: Two times a day (BID) | ORAL | Status: DC
Start: 2013-11-18 — End: 2013-11-20
  Administered 2013-11-18 – 2013-11-20 (×4): 100 mg via ORAL
  Filled 2013-11-18 (×6): qty 1

## 2013-11-18 MED ORDER — OXYCODONE HCL 5 MG PO TABS
5.0000 mg | ORAL_TABLET | ORAL | Status: DC | PRN
  Administered 2013-11-18 – 2013-11-19 (×5): 10 mg via ORAL
  Administered 2013-11-19: 5 mg via ORAL
  Administered 2013-11-19 – 2013-11-20 (×7): 10 mg via ORAL
  Filled 2013-11-18 (×12): qty 2

## 2013-11-18 MED ORDER — ROCURONIUM BROMIDE 100 MG/10ML IV SOLN
INTRAVENOUS | Status: DC | PRN
  Administered 2013-11-18: 50 mg via INTRAVENOUS

## 2013-11-18 MED ORDER — OXYCODONE HCL 5 MG PO TABS
ORAL_TABLET | ORAL | Status: AC
  Filled 2013-11-18: qty 2

## 2013-11-18 MED ORDER — HYDROMORPHONE HCL PF 1 MG/ML IJ SOLN
0.2500 mg | INTRAMUSCULAR | Status: DC | PRN
  Administered 2013-11-18 (×7): 0.5 mg via INTRAVENOUS

## 2013-11-18 MED ORDER — DEXTROSE-NACL 5-0.45 % IV SOLN: INTRAVENOUS | Status: DC

## 2013-11-18 MED ORDER — ONDANSETRON HCL 4 MG/2ML IJ SOLN: 4.0000 mg | Freq: Once | INTRAMUSCULAR | Status: DC | PRN

## 2013-11-18 MED ORDER — TRANEXAMIC ACID 100 MG/ML IV SOLN
1000.0000 mg | INTRAVENOUS | Status: AC
  Administered 2013-11-18: 1000 mg via INTRAVENOUS
  Filled 2013-11-18: qty 10

## 2013-11-18 MED ORDER — CHLORHEXIDINE GLUCONATE 4 % EX LIQD: 60.0000 mL | Freq: Once | CUTANEOUS | Status: DC

## 2013-11-18 MED ORDER — HYDROMORPHONE HCL PF 1 MG/ML IJ SOLN
1.0000 mg | INTRAMUSCULAR | Status: DC | PRN
  Administered 2013-11-18 – 2013-11-19 (×6): 1 mg via INTRAVENOUS
  Filled 2013-11-18 (×6): qty 1

## 2013-11-18 MED ORDER — NEOSTIGMINE METHYLSULFATE 1 MG/ML IJ SOLN
INTRAMUSCULAR | Status: AC
  Filled 2013-11-18: qty 10

## 2013-11-18 MED ORDER — EPHEDRINE SULFATE 50 MG/ML IJ SOLN
INTRAMUSCULAR | Status: DC | PRN
  Administered 2013-11-18 (×5): 10 mg via INTRAVENOUS

## 2013-11-18 MED ORDER — FENTANYL CITRATE 0.05 MG/ML IJ SOLN
INTRAMUSCULAR | Status: AC
  Filled 2013-11-18: qty 5

## 2013-11-18 MED ORDER — SENNOSIDES-DOCUSATE SODIUM 8.6-50 MG PO TABS: 1.0000 | ORAL_TABLET | Freq: Every evening | ORAL | Status: DC | PRN

## 2013-11-18 MED ORDER — ALBUMIN HUMAN 5 % IV SOLN
INTRAVENOUS | Status: DC | PRN
  Administered 2013-11-18: 11:00:00 via INTRAVENOUS

## 2013-11-18 MED ORDER — SODIUM CHLORIDE 0.9 % IR SOLN
Status: DC | PRN
  Administered 2013-11-18: 1000 mL

## 2013-11-18 MED ORDER — GLYCOPYRROLATE 0.2 MG/ML IJ SOLN
INTRAMUSCULAR | Status: DC | PRN
  Administered 2013-11-18: 0.6 mg via INTRAVENOUS

## 2013-11-18 MED ORDER — METHOCARBAMOL 500 MG PO TABS
ORAL_TABLET | ORAL | Status: AC
  Filled 2013-11-18: qty 1

## 2013-11-18 MED ORDER — METHOCARBAMOL 100 MG/ML IJ SOLN
500.0000 mg | Freq: Four times a day (QID) | INTRAMUSCULAR | Status: DC | PRN
  Filled 2013-11-18: qty 5

## 2013-11-18 MED ORDER — PROPOFOL 10 MG/ML IV BOLUS
INTRAVENOUS | Status: DC | PRN
  Administered 2013-11-18: 180 mg via INTRAVENOUS

## 2013-11-18 MED ORDER — ONDANSETRON HCL 4 MG PO TABS: 4.0000 mg | ORAL_TABLET | Freq: Four times a day (QID) | ORAL | Status: DC | PRN

## 2013-11-18 MED ORDER — METOCLOPRAMIDE HCL 10 MG PO TABS: 5.0000 mg | ORAL_TABLET | Freq: Three times a day (TID) | ORAL | Status: DC | PRN

## 2013-11-18 MED ORDER — LEVOTHYROXINE SODIUM 50 MCG PO TABS
50.0000 ug | ORAL_TABLET | Freq: Every day | ORAL | Status: DC
Start: 2013-11-19 — End: 2013-11-20
  Administered 2013-11-19 – 2013-11-20 (×2): 50 ug via ORAL
  Filled 2013-11-18 (×3): qty 1

## 2013-11-18 MED ORDER — OXYCODONE-ACETAMINOPHEN 5-325 MG PO TABS: 1.0000 | ORAL_TABLET | ORAL | Status: DC | PRN

## 2013-11-18 MED ORDER — ONDANSETRON HCL 4 MG/2ML IJ SOLN
INTRAMUSCULAR | Status: AC
  Filled 2013-11-18: qty 2

## 2013-11-18 MED ORDER — HYDROMORPHONE HCL PF 1 MG/ML IJ SOLN: 0.2500 mg | INTRAMUSCULAR | Status: DC | PRN

## 2013-11-18 MED ORDER — ACETAMINOPHEN 650 MG RE SUPP: 650.0000 mg | Freq: Four times a day (QID) | RECTAL | Status: DC | PRN

## 2013-11-18 MED ORDER — ONDANSETRON HCL 4 MG/2ML IJ SOLN: 4.0000 mg | Freq: Four times a day (QID) | INTRAMUSCULAR | Status: DC | PRN

## 2013-11-18 MED ORDER — MAGNESIUM CITRATE PO SOLN: 1.0000 | Freq: Once | ORAL | Status: AC | PRN

## 2013-11-18 MED ORDER — PHENOL 1.4 % MT LIQD: 1.0000 | OROMUCOSAL | Status: DC | PRN

## 2013-11-18 MED ORDER — MENTHOL 3 MG MT LOZG: 1.0000 | LOZENGE | OROMUCOSAL | Status: DC | PRN

## 2013-11-18 MED ORDER — DEXTROSE 5 % IV SOLN
INTRAVENOUS | Status: DC | PRN
  Administered 2013-11-18: 10:00:00 via INTRAVENOUS

## 2013-11-18 SURGICAL SUPPLY — 52 items
BLADE SAW SGTL 18X1.27X75 (BLADE) ×2 IMPLANT
BLADE SAW SGTL 18X1.27X75MM (BLADE) ×1
BRUSH FEMORAL CANAL (MISCELLANEOUS) IMPLANT
CAPT HIP PF COP ×2 IMPLANT
COVER BACK TABLE 24X17X13 BIG (DRAPES) IMPLANT
COVER SURGICAL LIGHT HANDLE (MISCELLANEOUS) ×6 IMPLANT
DRAPE ORTHO SPLIT 77X108 STRL (DRAPES) ×3
DRAPE PROXIMA HALF (DRAPES) ×3 IMPLANT
DRAPE SURG ORHT 6 SPLT 77X108 (DRAPES) ×1 IMPLANT
DRAPE U-SHAPE 47X51 STRL (DRAPES) ×3 IMPLANT
DRILL BIT 7/64X5 (BIT) ×3 IMPLANT
DRSG AQUACEL AG ADV 3.5X10 (GAUZE/BANDAGES/DRESSINGS) ×3 IMPLANT
DURAPREP 26ML APPLICATOR (WOUND CARE) ×3 IMPLANT
ELECT BLADE 4.0 EZ CLEAN MEGAD (MISCELLANEOUS)
ELECT REM PT RETURN 9FT ADLT (ELECTROSURGICAL) ×3
ELECTRODE BLDE 4.0 EZ CLN MEGD (MISCELLANEOUS) IMPLANT
ELECTRODE REM PT RTRN 9FT ADLT (ELECTROSURGICAL) ×1 IMPLANT
GAUZE XEROFORM 1X8 LF (GAUZE/BANDAGES/DRESSINGS) ×3 IMPLANT
GLOVE BIO SURGEON STRL SZ7.5 (GLOVE) ×3 IMPLANT
GLOVE BIO SURGEON STRL SZ8.5 (GLOVE) ×6 IMPLANT
GLOVE BIOGEL PI IND STRL 8 (GLOVE) ×2 IMPLANT
GLOVE BIOGEL PI IND STRL 9 (GLOVE) ×1 IMPLANT
GLOVE BIOGEL PI INDICATOR 8 (GLOVE) ×4
GLOVE BIOGEL PI INDICATOR 9 (GLOVE) ×2
GOWN STRL REUS W/ TWL LRG LVL3 (GOWN DISPOSABLE) ×2 IMPLANT
GOWN STRL REUS W/ TWL XL LVL3 (GOWN DISPOSABLE) ×3 IMPLANT
GOWN STRL REUS W/TWL LRG LVL3 (GOWN DISPOSABLE) ×6
GOWN STRL REUS W/TWL XL LVL3 (GOWN DISPOSABLE) ×9
HANDPIECE INTERPULSE COAX TIP (DISPOSABLE)
HOOD PEEL AWAY FACE SHEILD DIS (HOOD) ×6 IMPLANT
KIT BASIN OR (CUSTOM PROCEDURE TRAY) ×3 IMPLANT
KIT ROOM TURNOVER OR (KITS) ×3 IMPLANT
MANIFOLD NEPTUNE II (INSTRUMENTS) ×3 IMPLANT
NEEDLE 22X1 1/2 (OR ONLY) (NEEDLE) ×3 IMPLANT
NS IRRIG 1000ML POUR BTL (IV SOLUTION) ×3 IMPLANT
PACK TOTAL JOINT (CUSTOM PROCEDURE TRAY) ×3 IMPLANT
PAD ARMBOARD 7.5X6 YLW CONV (MISCELLANEOUS) ×6 IMPLANT
PASSER SUT SWANSON 36MM LOOP (INSTRUMENTS) ×3 IMPLANT
PRESSURIZER FEMORAL UNIV (MISCELLANEOUS) IMPLANT
SET HNDPC FAN SPRY TIP SCT (DISPOSABLE) IMPLANT
SUT ETHIBOND 2 V 37 (SUTURE) ×3 IMPLANT
SUT ETHILON 3 0 FSL (SUTURE) ×3 IMPLANT
SUT VIC AB 0 CTB1 27 (SUTURE) ×3 IMPLANT
SUT VIC AB 1 CTX 36 (SUTURE) ×3
SUT VIC AB 1 CTX36XBRD ANBCTR (SUTURE) ×1 IMPLANT
SUT VIC AB 2-0 CTB1 (SUTURE) ×3 IMPLANT
SYR CONTROL 10ML LL (SYRINGE) ×3 IMPLANT
TOWEL OR 17X24 6PK STRL BLUE (TOWEL DISPOSABLE) ×3 IMPLANT
TOWEL OR 17X26 10 PK STRL BLUE (TOWEL DISPOSABLE) ×3 IMPLANT
TOWER CARTRIDGE SMART MIX (DISPOSABLE) IMPLANT
TRAY FOLEY CATH 14FR (SET/KITS/TRAYS/PACK) IMPLANT
WATER STERILE IRR 1000ML POUR (IV SOLUTION) ×12 IMPLANT

## 2013-11-18 NOTE — Evaluation (Signed)
Physical Therapy Evaluation Patient Details Name: Cody Mcdonald C Bauder MRN: [id number] DOB: 26-Aug-1976 Today's Date: 11/18/2013 Time: [id number] PT Time Calculation (min): 25 min  PT Assessment / Plan / Recommendation History of Present Illness  Patient is a 38 yo male s/p Rt THA due to congenital hip dysplasia.  Patient had Lt THA in 09/2013.  Clinical Impression  Patient presents with problems listed below.  Will benefit from acute PT to maximize independence prior to discharge home with family.    PT Assessment  Patient needs continued PT services    Follow Up Recommendations  Home health PT;Supervision/Assistance - 24 hour    Does the patient have the potential to tolerate intense rehabilitation      Barriers to Discharge        Equipment Recommendations  None recommended by PT    Recommendations for Other Services     Frequency 7X/week    Precautions / Restrictions Precautions Precautions: Posterior Hip Precaution Booklet Issued: Yes (comment) Precaution Comments: Reviewed hip precautions - patient able to recall from prior surgery. Restrictions Weight Bearing Restrictions: Yes RLE Weight Bearing: Weight bearing as tolerated   Pertinent Vitals/Pain       Mobility  Bed Mobility Overal bed mobility: Modified Independent General bed mobility comments: Patient able to move to EOB with use of bed rail.  Able to return to supine with use of trapeze. Transfers Overall transfer level: Needs assistance Equipment used: Rolling walker (2 wheeled) Transfers: Sit to/from Stand Sit to Stand: Min guard General transfer comment: Verbal cues for hand placement.  Patient able to move to standing using safe technique. Ambulation/Gait Ambulation/Gait assistance: Min guard Ambulation Distance (Feet): 160 Feet Assistive device: Rolling walker (2 wheeled) Gait Pattern/deviations: Step-through pattern;Decreased stance time - right;Decreased step length - left;Decreased weight shift to  right;Antalgic Gait velocity: Decreased Gait velocity interpretation: Below normal speed for age/gender General Gait Details: Verbal cues for use of RW and gait sequence.    Exercises Total Joint Exercises Ankle Circles/Pumps: AROM;Both;10 reps;Seated   PT Diagnosis: Difficulty walking;Acute pain  PT Problem List: Decreased strength;Decreased balance;Decreased mobility;Decreased safety awareness;Decreased knowledge of precautions;Pain PT Treatment Interventions: DME instruction;Gait training;Stair training;Functional mobility training;Therapeutic exercise;Patient/family education     PT Goals(Current goals can be found in the care plan section) Acute Rehab PT Goals Patient Stated Goal: To go home soon PT Goal Formulation: With patient/family Time For Goal Achievement: 11/25/13 Potential to Achieve Goals: Good  Visit Information  Last PT Received On: 11/18/13 Assistance Needed: +1 History of Present Illness: Patient is a 38 yo male s/p Rt THA due to congenital hip dysplasia.  Patient had Lt THA in 09/2013.       Prior Functioning  Home Living Family/patient expects to be discharged to:: Private residence Living Arrangements: Alone Available Help at Discharge: Family;Available 24 hours/day (Father staying with patient for 5-6 days.) Type of Home: House Home Access: Stairs to enter [employer] CorporationEntrance Stairs-Number of Steps: curb and 2 deep steps to get to door. Threshold step to enter. Entrance Stairs-Rails: None Home Layout: Two level;1/2 bath on main level Alternate Level Stairs-Number of Steps: Flight Alternate Level Stairs-Rails: Can reach both Home Equipment: Walker - 2 wheels;Shower seat;Bedside commode;Crutches Prior Function Level of Independence: Independent Communication Communication: No difficulties Dominant Hand: Right    Cognition  Cognition Arousal/Alertness: Awake/alert Behavior During Therapy: WFL for tasks assessed/performed Overall Cognitive Status: Within  Functional Limits for tasks assessed    Extremity/Trunk Assessment Upper Extremity Assessment Upper Extremity Assessment: Overall WFL for tasks assessed  Lower Extremity Assessment Lower Extremity Assessment: RLE deficits/detail RLE Deficits / Details: Decreased strength due to pain/surgery.  Able to lift RLE against gravity. Cervical / Trunk Assessment Cervical / Trunk Assessment: Normal   Balance    End of Session PT - End of Session Equipment Utilized During Treatment: Gait belt Activity Tolerance: Patient tolerated treatment well Patient left: in bed;with call bell/phone within reach;with family/visitor present (Patient declined to sit in recliner) Nurse Communication: Mobility status  GP     Vena Austria 11/18/2013, 7:12 PM Durenda Hurt. Renaldo Fiddler, [facility] Acute Rehab Services Pager [phone]

## 2013-11-18 NOTE — Anesthesia Postprocedure Evaluation (Signed)
  Anesthesia Post-op Note  Patient: Cody Mcdonald  Procedure(s) Performed: Procedure(s): TOTAL HIP ARTHROPLASTY (Right)  Patient Location: PACU  Anesthesia Type:General  Level of Consciousness: awake, alert , oriented and patient cooperative  Airway and Oxygen Therapy: Patient Spontanous Breathing  Post-op Pain: mild  Post-op Assessment: Post-op Vital signs reviewed, Patient's Cardiovascular Status Stable, Respiratory Function Stable, Patent Airway, No signs of Nausea or vomiting and Pain level controlled  Post-op Vital Signs: stable  Complications: No apparent anesthesia complications

## 2013-11-18 NOTE — Anesthesia Preprocedure Evaluation (Addendum)
Anesthesia Evaluation  Patient identified by MRN, date of birth, ID band Patient awake    Reviewed: Allergy & Precautions, H&P , NPO status , Patient's Chart, lab work & pertinent test results  Airway       Dental   Pulmonary          Cardiovascular     Neuro/Psych    GI/Hepatic GERD-  ,  Endo/Other    Renal/GU      Musculoskeletal  (+) Arthritis -,   Abdominal   Peds  Hematology  (+) Blood dyscrasia, anemia ,   Anesthesia Other Findings Thalassemia minor  Reproductive/Obstetrics                          Anesthesia Physical Anesthesia Plan  ASA: II  Anesthesia Plan: General   Post-op Pain Management:    Induction: Intravenous  Airway Management Planned: Oral ETT  Additional Equipment:   Intra-op Plan:   Post-operative Plan: Extubation in OR  Informed Consent: I have reviewed the patients History and Physical, chart, labs and discussed the procedure including the risks, benefits and alternatives for the proposed anesthesia with the patient or authorized representative who has indicated his/her understanding and acceptance.     Plan Discussed with:   Anesthesia Plan Comments:         Anesthesia Quick Evaluation

## 2013-11-18 NOTE — Anesthesia Procedure Notes (Addendum)
Procedure Name: Intubation Date/Time: 11/18/2013 10:04 AM Performed by: Darcey NoraJAMES, Paxtyn Boyar B Pre-anesthesia Checklist: Patient identified, Emergency Drugs available, Suction available and Patient being monitored Patient Re-evaluated:Patient Re-evaluated prior to inductionOxygen Delivery Method: Circle system utilized Preoxygenation: Pre-oxygenation with 100% oxygen Intubation Type: IV induction Ventilation: Mask ventilation without difficulty Laryngoscope Size: Mac and 3 Grade View: Grade I Tube type: Oral Tube size: 7.5 mm Number of attempts: 1 (dr. Katrinka BlazingSmith supervising visiting EMT student) Airway Equipment and Method: Stylet Placement Confirmation: ETT inserted through vocal cords under direct vision,  breath sounds checked- equal and bilateral and positive ETCO2 Secured at: 22 (cm at teeth) cm Tube secured with: Tape Dental Injury: Teeth and Oropharynx as per pre-operative assessment

## 2013-11-18 NOTE — Preoperative (Signed)
Beta Blockers   Reason not to administer Beta Blockers:Not Applicable 

## 2013-11-18 NOTE — Progress Notes (Signed)
Orthopedic Tech Progress Note Patient Details:  Janelle Floorhomas C Strand 1976/08/08 [id number]  Ortho Devices Ortho Device/Splint Location: trapeze bar patient helper Ortho Device/Splint Interventions: Application Viewed order from doctor's order list  Nikki DomCrawford, Mehkai Gallo 11/18/2013, 1:59 PM

## 2013-11-18 NOTE — Progress Notes (Signed)
Utilization review completed.  

## 2013-11-18 NOTE — Plan of Care (Signed)
Problem: Consults Goal: Diagnosis- Total Joint Replacement Primary Total Hip Right     

## 2013-11-18 NOTE — Interval H&P Note (Signed)
History and Physical Interval Note:  11/18/2013 9:21 AM  Cody Mcdonald  has presented today for surgery, with the diagnosis of DYSPLASIA RIGHT HIP  The various methods of treatment have been discussed with the patient and family. After consideration of risks, benefits and other options for treatment, the patient has consented to  Procedure(s): TOTAL HIP ARTHROPLASTY (Right) as a surgical intervention .  The patient's history has been reviewed, patient examined, no change in status, stable for surgery.  I have reviewed the patient's chart and labs.  Questions were answered to the patient's satisfaction.     Nestor LewandowskyOWAN,[place]

## 2013-11-18 NOTE — Transfer of Care (Signed)
Immediate Anesthesia Transfer of Care Note  Patient: Cody Mcdonald  Procedure(s) Performed: Procedure(s): TOTAL HIP ARTHROPLASTY (Right)  Patient Location: PACU  Anesthesia Type:General  Level of Consciousness: awake, alert , oriented and patient cooperative  Airway & Oxygen Therapy: Patient Spontanous Breathing and Patient connected to nasal cannula oxygen  Post-op Assessment: Report given to PACU RN, Post -op Vital signs reviewed and stable and Patient moving all extremities  Post vital signs: Reviewed and stable  Complications: No apparent anesthesia complications

## 2013-11-18 NOTE — Op Note (Signed)
OPERATIVE REPORT    DATE OF PROCEDURE:  11/18/2013       PREOPERATIVE DIAGNOSIS:  DYSPLASIA RIGHT HIP                                                          POSTOPERATIVE DIAGNOSIS:  DYSPLASIA RIGHT HIP                                                           PROCEDURE:  R total hip arthroplasty using a 52 mm DePuy Pinnacle  Cup, Pe[employer]ody [employer] Hole Eliminator, 10-degree polyethylene liner index superior  and posterior, a +0 36 mm ceramic head, a 20x15x36x160 SROM stem, 20Bsm Sleeve   SURGEON: Clemence Lengyel J    ASSISTANT:   Eric K. [employer] PA-C  (present throughout entire procedure and necessary for timely completion of the procedure)   ANESTHESIA: General BLOOD LOSS: 500 FLUID REPLACEMENT: 1500 crystalloid DRAINS: Foley Catheter URINE OUTPUT: 300cc COMPLICATIONS: none    INDICATIONS FOR PROCEDURE: A 38 y.o. 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 Despite conservative measures with observation, anti-inflammatory medicine, narcotics, use of a cane, has severe unremitting pain and can ambulate only a few blocks before resting.  Patient desires elective R total hip arthroplasty to decrease pain and increase function. The risks, benefits, and alternatives were discussed at length including but not limited to the risks of infection, bleeding, nerve injury, stiffness, blood clots, the need for revision surgery, cardiopulmonary complications, among others, and they were willing to proceed. Questions answered     PROCEDURE IN DETAIL: The patient was identified by armband,  [facility], taken to the operating room , appropriate anesthetic monitors  were attached and general endotracheal anesthesia induced. Foley catheter was inserted. Pt was rolled into the L lateral decubitus position and fixed there with a Stulberg Mark II pelvic clamp.  The R lower extremity was then prepped  and draped  in the usual sterile fashion from the ankle to the hemipelvis. A time-out  procedure was performed. The skin along the lateral hip and thigh  infiltrated with 10 mL of 0.5% Marcaine and epinephrine solution. We  then made a posterolateral approach to the hip. With a #10 blade, a 15 cm  incision was made through the skin and subcutaneous tissue down to the level of the  IT band. Small bleeders were identified and cauterized. The IT band was cut in  line with skin incision exposing the greater trochanter. A Cobra retractor was placed between the gluteus minimus and the superior hip joint capsule, and a spiked Cobra between the quadratus femoris and the inferior hip joint capsule. This isolated the short  external rotators and piriformis tendons. These were tagged with a #2 Ethibond  suture and cut off their insertion on the intertrochanteric crest. The posterior  capsule was then developed into an acetabular-based flap from Posterior Superior off of the acetabulum out over the femoral neck and back posterior inferior to the acetabular rim. This flap was tagged with two #2 Ethibond  sutures and retracted protecting the sciatic nerve. This exposed the arthritic femoral head and osteophytes. The hip was then flexed and internally rotated, dislocating the femoral head and a standard neck cut performed 1 fingerbreadth above the lesser trochanter.  A spiked Cobra was placed in the cotyloid notch and a Hohmann retractor was then used to lever the femur anteriorly off of the anterior pelvic column. A posterior-inferior wing retractor was placed at the junction of the acetabulum and the ischium completing the acetabular exposure.We then removed the peripheral osteophytes and labrum from the acetabulum. We then reamed the acetabulum up to 51 mm with basket reamers obtaining good coverage in all quadrants. We then irrigated with normal  saline solution and hammered into place a 52 mm pinnacle cup in 45   degrees of abduction and about 20 degrees of anteversion. More  peripheral osteophytes removed and a trial 10-degree liner placed with the  index superior-posterior. The hip was then flexed and internally rotated exposing the  proximal femur, which was entered with the initiating reamer followed by  the axial reamers up to a 15.5 mm full depth and 16mm partial depth. We then conically reamed to 20B to the correct depth for a 36 base neck. The calcar was milled to 20Bsm. A trial cone and stem was inserted in the 25 degrees anteversion, with a +0 36mm trial head. Trial reduction was then performed and excellent stability was noted with at 90 of flexion with 75 of internal rotation and then full extension with maximal external rotation. The hip could not be dislocated in full extension. The knee could easily flex  to about 130 degrees. We also stretched the abductors at this point,  because of the preexisting adductor contractures. All trial components  were then removed. The acetabulum was irrigated out with normal saline  solution. A titanium Apex [facility] was then screwed into place  followed by a 10-degree polyethylene liner index superior-posterior. On  the femoral side a 20Bsm ZTT1 sleeve was hammered into place, followed by a 20x15x36x160 SROM stem in 25 degrees of anteversion. At this point, a +0 36 mm ceramic head was  hammered on the stem. The hip was reduced. We checked our stability  one more time and found it to be excellent. The wound was once again  thoroughly irrigated out with normal saline solution pulse lavage. The  capsular flap and short external rotators were repaired back to the  intertrochanteric crest through drill holes with a #2 Ethibond suture.  The IT band was closed with running 1 Vicryl suture. The subcutaneous  tissue with 0 and 2-0 undyed Vicryl suture and the skin with running  interlocking 3-0 nylon suture. Dressing of Xeroform and Mepilex was  then  applied. The patient was then unclamped, rolled supine, awaken extubated and taken to recovery room without difficulty in stable condition.   Adahlia Stembridge J 11/18/2013, 11:12 AM

## 2013-11-19 ENCOUNTER — Encounter (HOSPITAL_COMMUNITY): Payer: Self-pay | Admitting: Orthopedic Surgery

## 2013-11-19 LAB — CBC
HCT: 26.1 % — ABNORMAL LOW (ref 39.0–52.0)
HEMOGLOBIN: 8.4 g/dL — AB (ref 13.0–17.0)
MCH: 20.9 pg — ABNORMAL LOW (ref 26.0–34.0)
MCHC: 32.2 g/dL (ref 30.0–36.0)
MCV: 64.9 fL — AB (ref 78.0–100.0)
Platelets: 128 10*3/uL — ABNORMAL LOW (ref 150–400)
RBC: 4.02 MIL/uL — ABNORMAL LOW (ref 4.22–5.81)
RDW: 14.8 % (ref 11.5–15.5)
WBC: 8.6 10*3/uL (ref 4.0–10.5)

## 2013-11-19 LAB — BASIC METABOLIC PANEL
BUN: 11 mg/dL (ref 6–23)
CHLORIDE: 97 meq/L (ref 96–112)
CO2: 24 mEq/L (ref 19–32)
CREATININE: 0.95 mg/dL (ref 0.50–1.35)
Calcium: 9 mg/dL (ref 8.4–10.5)
GFR calc Af Amer: >90 mL/min (ref >90)
GFR calc non Af Amer: >90 mL/min (ref >90)
GLUCOSE: 129 mg/dL — AB (ref 70–99)
POTASSIUM: 4 meq/L (ref 3.7–5.3)
Sodium: 134 mEq/L — ABNORMAL LOW (ref 137–147)

## 2013-11-19 NOTE — Evaluation (Signed)
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  Patient had Lt THA in 09/2013.   Clinical Impression   Pt seen for acute OT eval. PLOF is independent. Pt able to recall techniques from previous hip replacement surgery including precautions, shower transfer, LB dressing and bathing, and toileting. Pt ambulated and transferred to toilet with min guard and extra time. Pt has shower seat, reacher, sock aid at home and has used them previously. Pt limited this session by pain and muscle stiffness in RLE - needing min A for toilet clothing manipulation and EOB > supine. Pt stated he did not feel a need for further OT services given that this is his second hip replacement. Pt stated he understood need to mobilize to help relieve stiffness and pt appears highly motiviated to do so. Educated pt on having someone nearby when OOB for safety. No further acute OT needs at this time.    Follow Up Recommendations  No OT follow up    Equipment Recommendations  3 in 1 bedside comode    Recommendations for Other Services       Precautions / Restrictions Precautions Precautions: Posterior Hip Precaution Comments: Reviewed hip precautions - patient able to recall from prior surgery. Restrictions Weight Bearing Restrictions: Yes RLE Weight Bearing: Weight bearing as tolerated      Mobility Bed Mobility Overal bed mobility: Needs Assistance Bed Mobility: Supine to Sit     Supine to sit: Min assist     General bed mobility comments: min A for LE for EOB > supine. Pt has good UE strength to help compensate but today is limited by RLE muscle stiffness.   Transfers Overall transfer level: Needs assistance Equipment used: Rolling walker (2 wheeled) Transfers: Sit to/from Stand Sit to Stand: Min guard         General  transfer comment: extra time needed to perform min guard    Balance Overall balance assessment: Needs assistance Sitting-balance support: Feet supported Sitting balance-Leahy Scale: Poor Sitting balance - Comments: RLE muscle stiffness   Standing balance support: During functional activity;Bilateral upper extremity supported (RW) Standing balance-Leahy Scale: Poor                      ADL Eating/Feeding: Set up;Sitting Grooming: Set up;Sitting;Standing   Upper Body Dressing : Set up;Sitting Lower Body Bathing: Minimal assistance;Sit to/from stand;With adaptive equipment;Adhering to hip precautions;Cueing for compensatory techniques Lower Body Dressing: Minimal assistance;Min guard;With adaptive equipment;Cueing for compensatory techniques;Adhering to hip precautions;Sit to/from stand Toilet Transfer: Min guard;Ambulation;Comfort height toilet;Adhering to hip precautions;RW (3n1 over toilet) Toileting- Clothing Manipulation and Hygiene: Minimal assistance;Min guard;With adaptive equipment;Adhering to hip precautions;Sit to/from stand Tub/ Shower Transfer: Minimal assistance;Min guard Functional mobility during ADLs: Min guard;Rolling walker (extra time due to muscle stiffness/pain) General ADL Comments: Pt min guard to min A for ADLs. Pt is currenly limited in performance by muscle stiffness/pain in R hip. Pt has had previous hip replacement surgery and was able to verbalize posterior hip precautions, and recall ADL techniques/AE use from previous therapy. Encouraged mobility to help alleviate muscle stiffness with pt very willing to comply. Min A need for legs for EOB > supine due to muscle stiffness.               Pertinent Vitals/Pain 8/10 pain R hip. Nursing notified.  Increased activity during session.     Hand Dominance Right   Extremity/Trunk Assessment Upper Extremity Assessment Upper Extremity Assessment: Overall WFL for tasks assessed   Lower Extremity  Assessment Lower Extremity Assessment: Defer to PT evaluation       Communication Communication Communication: No difficulties   Cognition Arousal/Alertness: Awake/alert Behavior During Therapy: WFL for tasks assessed/performed Overall Cognitive Status: Within Functional Limits for tasks assessed                     General Comments       Exercises      Home Living Family/patient expects to be discharged to:: Private residence Living Arrangements: Alone Available Help at Discharge: Family;Available 24 hours/day Type of Home: House Home Access: Stairs to enter [employer] of Steps: curb and 2 deep steps to get to door. Threshold step to enter. Entrance Stairs-Rails: None Home Layout: Two level;1/2 bath on main level Alternate Level Stairs-Number of Steps: Flight Alternate Level Stairs-Rails: Can reach both Bathroom Shower/Tub: Chief Strategy Officer: Standard     Home Equipment: Environmental consultant - 2 wheels;Shower seat;Bedside commode;Crutches   Additional Comments: pt had therapy after last hip replacement surgery, reviewed previous therapy recommendations for ADLs with pt      Prior Functioning/Environment Level of Independence: Independent        Comments: Still working    OT Diagnosis:     OT Problem List:     OT Treatment/Interventions:      OT Goals(Current goals can be found in the care plan section) Acute Rehab OT Goals Patient Stated Goal: To go home soon OT Goal Formulation: With patient/family  OT Frequency:     Barriers to D/C:            End of Session: Equipment Utilized During Treatment: Gait belt;Rolling walker  Activity Tolerance: Patient tolerated treatment well;Patient limited by pain;Other (comment) (Pt limited by RLE muscle stiffness) Patient left: in bed;with family/visitor present   Time: 1610-9604 OT Time Calculation (min): 30 min Charges:  OT General Charges $OT Visit: 1 Procedure OT  Evaluation $Initial OT Evaluation Tier I: 1 Procedure OT Treatments $Self Care/Home Management : 23-37 mins G-Codes:    Raynald Kemp OTR/L Pager: [phone]  11/19/2013, 2:41 PM

## 2013-11-19 NOTE — Progress Notes (Signed)
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  Patient had Lt THA in 09/2013.    PT Comments    Continued pain this pm, but able to participate and only requiring min assist; Muscle guarding with therex Hopeful that pain will be better controlled tomorrow, and will be able to stair train and dc home  Follow Up Recommendations  Home health PT;Supervision/Assistance - 24 hour     Equipment Recommendations  None recommended by PT    Recommendations for Other Services OT consult     Precautions / Restrictions Precautions Precautions: Posterior Hip Precaution Comments: Reviewed hip precautions - patient able to recall from prior surgery. Restrictions Weight Bearing Restrictions: Yes RLE Weight Bearing: Weight bearing as tolerated    Mobility  Bed Mobility Overal bed mobility: Needs Assistance Bed Mobility: Supine to Sit;Sit to Supine     Supine to sit: Min assist Sit to supine: Min assist   General bed mobility comments: Cues for technique; moving slowly and painfully, needed min assist for RLE and close guard to avoid internal rotation  Transfers Overall transfer level: Needs assistance Equipment used: Rolling walker (2 wheeled) Transfers: Sit to/from Stand Sit to Stand: Min guard         General transfer comment: Verbal cues for hand placement.  Patient able to move to standing using safe technique.  Ambulation/Gait Ambulation/Gait assistance: Min guard Ambulation Distance (Feet): 80 Feet Assistive device: Rolling walker (2 wheeled) Gait Pattern/deviations: Decreased weight shift to right;Antalgic Gait velocity: Decreased   General Gait Details: Cues mostly to avoid internal rotation of RLE with turns; Very painful, but still moving   Stairs            Wheelchair Mobility     Modified Rankin (Stroke Patients Only)       Balance Overall balance assessment: Needs assistance Sitting-balance support: Feet supported Sitting balance-Leahy Scale: Poor Sitting balance - Comments: RLE muscle stiffness   Standing balance support: During functional activity;Bilateral upper extremity supported (RW) Standing balance-Leahy Scale: Poor                      Cognition Arousal/Alertness: Awake/alert Behavior During Therapy: WFL for tasks assessed/performed Overall Cognitive Status: Within Functional Limits for tasks assessed                      Exercises Total Joint Exercises Quad Sets: AROM;Right;10 reps Gluteal Sets: AROM;Both;10 reps Heel Slides: AAROM;Right;10 reps Hip ABduction/ADduction: AAROM;Right;10 reps    General Comments        Pertinent Vitals/Pain 8-9/10 R hip pain patient repositioned for comfort Ice applied    Home Living Family/patient expects to be discharged to:: Private residence Living Arrangements: Alone Available Help at Discharge: Family;Available 24 hours/day Type of Home: House Home Access: Stairs to enter Entrance Stairs-Rails: None Home Layout: Two level;1/2 bath on main level Home Equipment: Walker - 2 wheels;Shower seat;Bedside commode;Crutches Additional Comments: pt had therapy after last hip replacement surgery, reviewed previous therapy recommendations for ADLs with pt    Prior Function Level of Independence: Independent      Comments: Still working   PT Goals (current goals can now be found in the care plan section) Acute Rehab PT Goals Patient Stated Goal: To go home soon PT Goal Formulation: With patient/family Time For Goal  Achievement: 11/25/13 Potential to Achieve Goals: Good Progress towards PT goals: Progressing toward goals (slowly)    Frequency  7X/week    PT Plan Current plan remains appropriate    End of Session   Activity Tolerance: Patient tolerated treatment  well;Patient limited by pain Patient left: with call bell/phone within reach;with family/visitor present;in bed     Time: 1449-1531 PT Time Calculation (min): 42 min  Charges:  $Gait Training: 8-22 mins $Therapeutic Exercise: 8-22 mins $Therapeutic Activity: 8-22 mins                    G Codes:      Olen Pel 11/19/2013, 3:57 PM  Van Clines, [place]  Acute Rehabilitation Services Pager [phone] Office [phone]

## 2013-11-19 NOTE — Care Management Note (Signed)
CARE MANAGEMENT NOTE 11/19/2013  Patient:  Cody Mcdonald,Cody Mcdonald   Account Number:  [id number]  Date Initiated:  11/19/2013  Documentation initiated by:  Vance PeperBRADY,Lorae Roig  Subjective/Objective Assessment:   38 yr old male s/p right total hip arthroplasty.     Action/Plan:   Patient preoperatively setup with [facility], no changes. Patient has rolling walker and 3in1 from previous surgery. Has family support at discharge.   Anticipated DC Date:  11/21/2013   Anticipated DC Plan:  HOME W HOME HEALTH SERVICES      DC Planning Services  CM consult      [facility]   Choice offered to / List presented to:  Mcdonald-1 Patient        HH arranged  HH-2 PT      [facility]   Status of service:  In process, will continue to follow

## 2013-11-19 NOTE — Progress Notes (Signed)
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  Patient had Lt THA in 09/2013.    PT Comments    Making progress with mobility despite extreme pain this am; moving slowly and painfully, but still able to amb in hallways; deferred therex at this time secondary to pain; Still on track for dc home tomorrow -- anticipate his pain will be better controlled and he will progress well  Follow Up Recommendations  Home health PT;Supervision/Assistance - 24 hour     Equipment Recommendations  None recommended by PT    Recommendations for Other Services OT consult     Precautions / Restrictions Precautions Precautions: Posterior Hip Precaution Comments: Reviewed hip precautions - patient able to recall from prior surgery. Restrictions RLE Weight Bearing: Weight bearing as tolerated    Mobility  Bed Mobility Overal bed mobility: Needs Assistance Bed Mobility: Supine to Sit     Supine to sit: Min assist     General bed mobility comments: Cues for technique; moving slowly and painfully, needed min assist for RLE and close guard to avoid internal rotation  Transfers Overall transfer level: Needs assistance Equipment used: Rolling walker (2 wheeled) Transfers: Sit to/from Stand Sit to Stand: Min guard         General transfer comment: Verbal cues for hand placement.  Patient able to move to standing using safe technique.  Ambulation/Gait Ambulation/Gait assistance: Min guard Ambulation Distance (Feet): 100 Feet Assistive device: Rolling walker (2 wheeled)   Gait velocity: Decreased   General Gait Details: Cues mostly to avoid internal rotation of RLE with turns   Stairs            Wheelchair Mobility    Modified Rankin (Stroke Patients Only)       Balance                                     Cognition Arousal/Alertness: Awake/alert Behavior During Therapy: WFL for tasks assessed/performed Overall Cognitive Status: Within Functional Limits for tasks assessed                      Exercises      General Comments        Pertinent Vitals/Pain 9/10 pain R hip; was premedicated for pain -- still willing to work patient repositioned for comfort     Home Living                      Prior Function            PT Goals (current goals can now be found in the care plan section) Acute Rehab PT Goals Patient Stated Goal: To go home soon PT Goal Formulation: With patient/family Time For Goal Achievement: 11/25/13 Potential to Achieve Goals: Good Progress towards PT goals: Progressing toward goals    Frequency  7X/week    PT Plan Current plan remains appropriate    End of Session   Activity Tolerance: Patient tolerated treatment well Patient left: in chair;with call bell/phone within reach;with family/visitor present     Time: 4098-1191 PT Time Calculation (min): 35 min  Charges:  $Gait Training: 8-22 mins $Therapeutic Activity: 8-22 mins  G Codes:      Van ClinesGarrigan, Aayana Reinertsen [facility] Association[facility]ff 11/19/2013, 12:41 PM Van ClinesHolly Jonessa Triplett, [place]  Acute Rehabilitation Services Pager [phone] Office [phone]

## 2013-11-19 NOTE — Progress Notes (Signed)
PATIENT ID: Cody Mcdonald C Pulver  MRN: [id number]  DOB/AGE:  1976-08-03 / 38 y.o.  1 Day Post-Op Procedure(s) (LRB): TOTAL HIP ARTHROPLASTY (Right)    PROGRESS NOTE Subjective: Patient is alert, oriented,no Nausea, no Vomiting, yes passing gas, no Bowel Movement. Taking PO well. Denies SOB, Chest or Calf Pain. Using Incentive Spirometer, PAS in place. Ambulate WBAT in hall Patient reports pain as 5 on 0-10 scale  .    Objective: Vital signs in last 24 hours: Filed Vitals:   11/18/13 1315 11/18/13 1330 11/18/13 2139 11/19/13 0623  BP:   140/60 146/68  Pulse: 71 70 72 74  Temp: 97.7 F (36.5 C)  98.6 F (37 C) 97.7 F (36.5 C)  TempSrc:      Resp: 14 10 18 18   Weight:      SpO2: 100% 100% 94% 99%      Intake/Output from previous day: I/O last 3 completed shifts: In: 2587.5 [I.V.:2337.5; IV Piggyback:250] Out: 2550 [Urine:2050; Blood:500]   Intake/Output this shift:     LABORATORY DATA:  Recent Labs  11/19/13 0615  WBC 8.6  HGB 8.4*  HCT 26.1*  PLT 128*    Examination: Neurologically intact ABD soft Neurovascular intact Sensation intact distally Intact pulses distally Dorsiflexion/Plantar flexion intact Incision: no drainage No cellulitis present Compartment soft} XR AP&Lat of hip shows well placed\fixed THA  Assessment:   1 Day Post-Op Procedure(s) (LRB): TOTAL HIP ARTHROPLASTY (Right) ADDITIONAL DIAGNOSIS:  Thallasemia, anemia  Plan: PT/OT WBAT, THA  posterior precautions  DVT Prophylaxis: SCDx72 hrs, ASA 325 mg BID x 2 weeks  DISCHARGE PLAN: Home, tomorrow  DISCHARGE NEEDS: HHPT, HHRN, CPM, Walker and 3-in-1 comode seat

## 2013-11-20 LAB — CBC
HEMATOCRIT: 24.7 % — AB (ref 39.0–52.0)
HEMOGLOBIN: 8 g/dL — AB (ref 13.0–17.0)
MCH: 20.9 pg — AB (ref 26.0–34.0)
MCHC: 32.4 g/dL (ref 30.0–36.0)
MCV: 64.7 fL — ABNORMAL LOW (ref 78.0–100.0)
Platelets: 130 10*3/uL — ABNORMAL LOW (ref 150–400)
RBC: 3.82 MIL/uL — ABNORMAL LOW (ref 4.22–5.81)
RDW: 15 % (ref 11.5–15.5)
WBC: 9.2 10*3/uL (ref 4.0–10.5)

## 2013-11-20 NOTE — Progress Notes (Signed)
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  Patient had Lt THA in 09/2013.    PT Comments    OK for dc home from PT standpoint Much better able to move today with pain under control  Follow Up Recommendations  Home health PT;Supervision/Assistance - 24 hour     Equipment Recommendations  None recommended by PT    Recommendations for Other Services       Precautions / Restrictions Precautions Precautions: Posterior Hip Precaution Comments: Reviewed hip precautions - patient able to recall from prior surgery. Restrictions Weight Bearing Restrictions: Yes RLE Weight Bearing: Weight bearing as tolerated    Mobility  Bed Mobility Overal bed mobility: Needs Assistance Bed Mobility: Supine to Sit     Supine to sit: Min assist     General bed mobility comments: Cues for technique; moving slowly and painfully, needed min assist for RLE and close guard to avoid internal rotation  Transfers Overall transfer level: Needs assistance Equipment used: Rolling walker (2 wheeled) Transfers: Sit to/from Stand Sit to Stand: Supervision         General transfer comment: Verbal cues for hand placement.  Patient able to move to standing using safe technique.  Ambulation/Gait Ambulation/Gait assistance: Supervision Ambulation Distance (Feet): 500 Feet Assistive device: Rolling walker (2 wheeled) Gait Pattern/deviations: Step-through pattern Gait velocity: Decreased   General Gait Details: Slow gait, but a lot smoother   Stairs Stairs: Yes Stairs assistance: Min guard Stair Management: Two rails;Step to pattern Number of Stairs: 5 General stair comments: Demonstrated correct technique  Wheelchair Mobility    Modified Rankin (Stroke Patients Only)       Balance                                     Cognition Arousal/Alertness: Awake/alert Behavior During Therapy: WFL for tasks assessed/performed Overall Cognitive Status: Within Functional Limits for tasks assessed                      Exercises      General Comments        Pertinent Vitals/Pain 6/10 R hip with motion; reports feels "looser" (in a good way) with amb patient repositioned for comfort     Home Living                      Prior Function            PT Goals (current goals can now be found in the care plan section) Acute Rehab PT Goals Patient Stated Goal: To go home soon PT Goal Formulation: With patient/family Time For Goal Achievement: 11/25/13 Potential to Achieve Goals: Good Progress towards PT goals: Progressing toward goals    Frequency  7X/week    PT Plan Current plan remains appropriate    End of Session   Activity Tolerance: Patient tolerated treatment well Patient left: in chair;with call bell/phone within reach;with family/visitor present     Time: [id number] PT Time Calculation (min): 28 min  Charges:  $Gait Training: 23-37 mins                    G Codes:      Van ClinesGarrigan, Cody Mcdonald 11/20/2013, 10:39 AM  Cody Mcdonald, [place]  Acute Rehabilitation Services Pager [phone] Office [phone]

## 2013-11-20 NOTE — Progress Notes (Signed)
PATIENT ID: Cody Mcdonald  MRN: [id number]  DOB/AGE:  Jan 18, 1976 / 38 y.o.  2 Days Post-Op Procedure(s) (LRB): TOTAL HIP ARTHROPLASTY (Right)    PROGRESS NOTE Subjective: Patient is alert, oriented,no Nausea, no Vomiting, yes passing gas, no Bowel Movement. Taking PO well. Denies SOB, Chest or Calf Pain. Using Incentive Spirometer, PAS in place. Ambulate WBAT Patient reports pain as mild  .    Objective: Vital signs in last 24 hours: Filed Vitals:   11/19/13 0623 11/19/13 1837 11/19/13 2100 11/20/13 0643  BP: 146/68  103/68 123/62  Pulse: 74  70 79  Temp: 97.7 F (36.5 C) 99.1 F (37.3 C) 99.2 F (37.3 C) 99.9 F (37.7 C)  TempSrc:    Oral  Resp: 18  18 18   Weight:      SpO2: 99%  99% 95%      Intake/Output from previous day: I/O last 3 completed shifts: In: 3285 [P.O.:360; I.V.:2925] Out: 5200 [Urine:5200]   Intake/Output this shift:     LABORATORY DATA:  Recent Labs  11/19/13 0615 11/20/13 0546  WBC 8.6 9.2  HGB 8.4* 8.0*  HCT 26.1* 24.7*  PLT 128* 130*  NA 134*  --   K 4.0  --   CL 97  --   CO2 24  --   BUN 11  --   CREATININE 0.95  --   GLUCOSE 129*  --   CALCIUM 9.0  --     Examination: Neurologically intact Neurovascular intact Sensation intact distally Intact pulses distally Dorsiflexion/Plantar flexion intact Incision: dressing C/D/I No cellulitis present Compartment soft} XR AP&Lat of hip shows well placed\fixed THA  Assessment:   2 Days Post-Op Procedure(s) (LRB): TOTAL HIP ARTHROPLASTY (Right) ADDITIONAL DIAGNOSIS:  Thallasemia, anemia  Plan: PT/OT WBAT, THA  posterior precautions  DVT Prophylaxis: SCDx72 hrs, ASA 325 mg BID x 2 weeks  DISCHARGE PLAN: Home today  DISCHARGE NEEDS: HHPT, HHRN, Walker and 3-in-1 comode seat

## 2013-11-20 NOTE — Discharge Summary (Signed)
Patient ID: Cody Mcdonald MRN: [id number] DOB/AGE: 12/12/1975 38 y.o.  Admit date: 11/18/2013 Discharge date: 11/20/2013  Admission Diagnoses:  Principal Problem:    Dysplasia of R Hip Active Problems:   Hip dysplasia, congenital   Discharge Diagnoses:  Same  Past Medical History  Diagnosis Date  . Hypothyroidism   . GERD (gastroesophageal reflux disease)   . Hip dysplasia, congenital   . Anemia     thalassemia    Surgeries: Procedure(s): TOTAL HIP ARTHROPLASTY on 11/18/2013   Consultants:    Discharged Condition: Improved  Hospital Course: Cody Floorhomas C Mcduffey is an 38 y.o. male who was admitted 11/18/2013 for operative treatment ofDysplasia of hip. Patient has severe unremitting pain that affects sleep, daily activities, and work/hobbies. After pre-op clearance the patient was taken to the operating room on 11/18/2013 and underwent  Procedure(s): TOTAL HIP ARTHROPLASTY.    Patient was given perioperative antibiotics: Anti-infectives   Start     Dose/Rate Route Frequency Ordered Stop   11/18/13 0600  ceFAZolin (ANCEF) IVPB 2 g/50 mL premix     2 g 100 mL/hr over 30 Minutes Intravenous On call to O.R. 11/17/13 1401 11/18/13 1010       Patient was given sequential compression devices, early ambulation, and chemoprophylaxis to prevent DVT.  Patient benefited maximally from hospital stay and there were no complications.    Recent vital signs: Patient Vitals for the past 24 hrs:  BP Temp Temp src Pulse Resp SpO2  11/20/13 0643 123/62 mmHg 99.9 F (37.7 C) Oral 79 18 95 %  11/19/13 2100 103/68 mmHg 99.2 F (37.3 C) - 70 18 99 %  11/19/13 1837 - 99.1 F (37.3 C) - - - -     Recent laboratory studies:  Recent Labs  11/19/13 0615 11/20/13 0546  WBC 8.6 9.2  HGB 8.4* 8.0*  HCT 26.1* 24.7*  PLT 128* 130*  NA 134*  --   K 4.0  --   CL 97  --   CO2 24  --   BUN 11  --   CREATININE 0.95  --   GLUCOSE 129*  --   CALCIUM 9.0  --      Discharge Medications:      Medication List         aspirin EC 325 MG tablet  Take 1 tablet (325 mg total) by mouth 2 (two) times daily.     levothyroxine 50 MCG tablet  Commonly known as:  SYNTHROID, LEVOTHROID  Take 50 mcg by mouth daily before breakfast.     methocarbamol 500 MG tablet  Commonly known as:  ROBAXIN  Take 1 tablet (500 mg total) by mouth 2 (two) times daily with a meal.     multivitamin with minerals tablet  Take 1 tablet by mouth daily.     oxyCODONE-acetaminophen 5-325 MG per tablet  Commonly known as:  ROXICET  Take 1 tablet by mouth every 4 (four) hours as needed.     traMADol 50 MG tablet  Commonly known as:  ULTRAM  Take 50 mg by mouth every 6 (six) hours as needed (pain).        Diagnostic Studies: Dg Pelvis Portable  11/18/2013   CLINICAL DATA:  Postop  EXAM: PORTABLE PELVIS 1-2 VIEWS  COMPARISON:  DG PELVIS PORTABLE dated 10/07/2013  FINDINGS: Interval right hip total arthroplasty. The femoral component and acetabular component appear well position. Left arthroplasty noted.  IMPRESSION: Right hip total arthroplasty without complicating features.   Electronically Signed  By: Genevive Bi M.D.   On: 11/18/2013 13:19    Disposition: 01-Home or Self Care      Discharge Orders   Future Orders Complete By Expires   Call MD / Call 911  As directed    Comments:     If you experience chest pain or shortness of breath, CALL 911 and be transported to the hospital emergency room.  If you develope a fever above 101 F, pus (white drainage) or increased drainage or redness at the wound, or calf pain, call your surgeon's office.   Change dressing  As directed    Comments:     You may change your dressing on day 5, then change the dressing daily with sterile 4 x 4 inch gauze dressing and paper tape.  You may clean the incision with alcohol prior to redressing   Constipation Prevention  As directed    Comments:     Drink plenty of fluids.  Prune juice may be helpful.  You may use a  stool softener, such as Colace (over the counter) 100 mg twice a day.  Use MiraLax (over the counter) for constipation as needed.   Diet - low sodium heart healthy  As directed    Discharge instructions  As directed    Comments:     Follow up in office with Dr. Turner Daniels in 2 weeks.   Driving restrictions  As directed    Comments:     No driving for 2 weeks   Follow the hip precautions as taught in Physical Therapy  As directed    Increase activity slowly as tolerated  As directed    Patient may shower  As directed    Comments:     You may shower without a dressing once there is no drainage.  Do not wash over the wound.  If drainage remains, cover wound with plastic wrap and then shower.      Follow-up Information   Follow up with Nestor Lewandowsky, MD In 2 weeks.   Specialty:  Orthopedic Surgery   Contact information:   [address] [place] [address] [phone]       Follow up with [facility]. (Someone from [facility] will contact you concerning Physical therapist start date and time.)    Contact information:   [address] [place] [address] [phone]       Follow up with Nestor Lewandowsky, MD In 2 weeks.   Specialty:  Orthopedic Surgery   Contact information:   [address] [place] [address] [phone]        Signed: Vear Clock Cynde Menard R 11/20/2013, 8:10 AM

## 2013-12-13 ENCOUNTER — Ambulatory Visit: Payer: BC Managed Care – PPO | Admitting: Physical Therapy

## 2013-12-13 ENCOUNTER — Ambulatory Visit: Payer: BC Managed Care – PPO | Attending: Orthopedic Surgery | Admitting: Physical Therapy

## 2013-12-13 DIAGNOSIS — R5381 Other malaise: Secondary | ICD-10-CM | POA: Insufficient documentation

## 2013-12-13 DIAGNOSIS — M25669 Stiffness of unspecified knee, not elsewhere classified: Secondary | ICD-10-CM | POA: Insufficient documentation

## 2013-12-13 DIAGNOSIS — IMO0001 Reserved for inherently not codable concepts without codable children: Secondary | ICD-10-CM | POA: Diagnosis present

## 2013-12-13 DIAGNOSIS — M25559 Pain in unspecified hip: Secondary | ICD-10-CM | POA: Insufficient documentation

## 2013-12-17 ENCOUNTER — Ambulatory Visit: Payer: BC Managed Care – PPO | Admitting: Physical Therapy

## 2013-12-17 DIAGNOSIS — IMO0001 Reserved for inherently not codable concepts without codable children: Secondary | ICD-10-CM | POA: Diagnosis not present

## 2013-12-19 ENCOUNTER — Ambulatory Visit: Payer: BC Managed Care – PPO

## 2013-12-19 DIAGNOSIS — IMO0001 Reserved for inherently not codable concepts without codable children: Secondary | ICD-10-CM | POA: Diagnosis not present

## 2013-12-25 ENCOUNTER — Ambulatory Visit: Payer: BC Managed Care – PPO | Admitting: Physical Therapy

## 2013-12-25 DIAGNOSIS — IMO0001 Reserved for inherently not codable concepts without codable children: Secondary | ICD-10-CM | POA: Diagnosis not present

## 2013-12-27 ENCOUNTER — Ambulatory Visit: Payer: BC Managed Care – PPO | Attending: Orthopedic Surgery | Admitting: Physical Therapy

## 2013-12-27 DIAGNOSIS — IMO0001 Reserved for inherently not codable concepts without codable children: Secondary | ICD-10-CM | POA: Insufficient documentation

## 2013-12-27 DIAGNOSIS — M25559 Pain in unspecified hip: Secondary | ICD-10-CM | POA: Insufficient documentation

## 2013-12-27 DIAGNOSIS — R5381 Other malaise: Secondary | ICD-10-CM | POA: Insufficient documentation

## 2013-12-27 DIAGNOSIS — M25669 Stiffness of unspecified knee, not elsewhere classified: Secondary | ICD-10-CM | POA: Insufficient documentation

## 2013-12-30 ENCOUNTER — Ambulatory Visit: Payer: BC Managed Care – PPO | Admitting: Physical Therapy

## 2014-01-01 ENCOUNTER — Encounter: Payer: BC Managed Care – PPO | Admitting: Physical Therapy

## 2014-01-01 ENCOUNTER — Ambulatory Visit: Payer: BC Managed Care – PPO | Admitting: Physical Therapy

## 2014-01-03 ENCOUNTER — Ambulatory Visit: Payer: BC Managed Care – PPO | Admitting: Physical Therapy

## 2014-01-06 ENCOUNTER — Ambulatory Visit: Payer: BC Managed Care – PPO | Admitting: Physical Therapy

## 2014-01-08 ENCOUNTER — Ambulatory Visit: Payer: BC Managed Care – PPO | Admitting: Physical Therapy

## 2014-01-13 ENCOUNTER — Ambulatory Visit: Payer: BC Managed Care – PPO | Admitting: Physical Therapy

## 2014-01-15 ENCOUNTER — Ambulatory Visit: Payer: BC Managed Care – PPO | Admitting: Physical Therapy

## 2014-06-25 ENCOUNTER — Encounter (HOSPITAL_COMMUNITY): Payer: Self-pay | Admitting: Emergency Medicine

## 2014-06-25 ENCOUNTER — Emergency Department (HOSPITAL_COMMUNITY)
Admission: EM | Admit: 2014-06-25 | Discharge: 2014-06-25 | Disposition: A | Discharge status: Home / Self Care | Payer: Worker's Compensation | Attending: Emergency Medicine | Admitting: Emergency Medicine

## 2014-06-25 DIAGNOSIS — S60511A Abrasion of right hand, initial encounter: Secondary | ICD-10-CM | POA: Insufficient documentation

## 2014-06-25 DIAGNOSIS — Y9289 Other specified places as the place of occurrence of the external cause: Secondary | ICD-10-CM | POA: Diagnosis not present

## 2014-06-25 DIAGNOSIS — Z23 Encounter for immunization: Secondary | ICD-10-CM | POA: Diagnosis not present

## 2014-06-25 DIAGNOSIS — Z79899 Other long term (current) drug therapy: Secondary | ICD-10-CM | POA: Insufficient documentation

## 2014-06-25 DIAGNOSIS — D649 Anemia, unspecified: Secondary | ICD-10-CM | POA: Insufficient documentation

## 2014-06-25 DIAGNOSIS — Z8719 Personal history of other diseases of the digestive system: Secondary | ICD-10-CM | POA: Diagnosis not present

## 2014-06-25 DIAGNOSIS — E039 Hypothyroidism, unspecified: Secondary | ICD-10-CM | POA: Diagnosis not present

## 2014-06-25 DIAGNOSIS — S6991XA Unspecified injury of right wrist, hand and finger(s), initial encounter: Secondary | ICD-10-CM | POA: Diagnosis present

## 2014-06-25 DIAGNOSIS — Y990 Civilian activity done for income or pay: Secondary | ICD-10-CM | POA: Diagnosis not present

## 2014-06-25 DIAGNOSIS — Y288XXA Contact with other sharp object, undetermined intent, initial encounter: Secondary | ICD-10-CM | POA: Diagnosis not present

## 2014-06-25 MED ORDER — CEPHALEXIN 500 MG PO CAPS: 500.0000 mg | ORAL_CAPSULE | Freq: Three times a day (TID) | ORAL | Status: AC

## 2014-06-25 MED ORDER — TETANUS-DIPHTH-ACELL PERTUSSIS 5-2.5-18.5 LF-MCG/0.5 IM SUSP
0.5000 mL | Freq: Once | INTRAMUSCULAR | Status: AC
  Administered 2014-06-25: 0.5 mL via INTRAMUSCULAR
  Filled 2014-06-25: qty 0.5

## 2014-06-25 NOTE — ED Notes (Signed)
Pt cut his hand on door at work.  States that this will be worker's comp.  Went to [facility] and they told him that they couldn't treat him because he didn't get "permission" to be seen from his work.

## 2014-06-25 NOTE — ED Notes (Signed)
Wound care has been performed on Pt's R hand. Bacitracin was applied, per EDP Walden's request. A non adhesive pad was placed over the wound and gauze was used to secure the non-adhesive pad in place.   Pt was instructed how to apply the bandage and how to clean his wound at home. Pt verbalized understanding of both.   Pt denied any further needs at this time.

## 2014-06-25 NOTE — ED Provider Notes (Signed)
CSN: [id number]     Arrival date & time 06/25/14  1853 History   First MD Initiated Contact with Patient 06/25/14 2014     Chief Complaint  Patient presents with  . Hand Injury     (Consider location/radiation/quality/duration/timing/severity/associated sxs/prior Treatment) Patient is a 38 y.o. male presenting with hand injury. The history is provided by the patient.  Hand Injury Location:  Hand Injury: yes   Mechanism of injury comment:  Cut on a door Hand location:  R palm Pain details:    Quality:  Aching   Radiates to:  Does not radiate   Severity:  No pain   Onset quality:  Sudden   Timing:  Constant   Progression:  Unchanged Chronicity:  New Handedness:  Right-handed Dislocation: no   Foreign body present:  No foreign bodies Tetanus status:  Out of date Prior injury to area:  No Relieved by:  Nothing Worsened by:  Nothing tried Associated symptoms: no fever     Past Medical History  Diagnosis Date  . Hypothyroidism   . GERD (gastroesophageal reflux disease)   . Hip dysplasia, congenital   . Anemia     thalassemia   Past Surgical History  Procedure Laterality Date  . Wisdom tooth extraction  12/2010  . 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 Total hip arthroplasty Left 10/07/2013    Procedure: TOTAL HIP ARTHROPLASTY;  Surgeon: Nestor LewandowskyFrank J Rowan, MD;  Location: [facility] OR;  Service: Orthopedics;  Laterality: Left;  . Total hip arthroplasty Right 11/18/2013    Procedure: TOTAL HIP ARTHROPLASTY;  Surgeon: Nestor LewandowskyFrank J Rowan, MD;  Location: [facility] OR;  Service: Orthopedics;  Laterality: Right;   No family history on file. History  Substance Use Topics  . Smoking status: Never Smoker   . Smokeless tobacco: Former NeurosurgeonUser    Types: Chew  . Alcohol Use: No    Review of Systems  Constitutional: Negative for fever and chills.  Respiratory: Negative for cough and shortness of breath.   All other systems reviewed and are negative.     Allergies  Review of  patient's allergies indicates no known allergies.  Home Medications   Prior to Admission medications   Medication Sig Start Date End Date Taking? Authorizing Provider  doxylamine, Sleep, (UNISOM) 25 MG tablet Take 25 mg by mouth at bedtime as needed for sleep (sleep).   Yes Historical Provider, MD  levothyroxine (SYNTHROID, LEVOTHROID) 50 MCG tablet Take 50 mcg by mouth daily before breakfast.   Yes Historical Provider, MD  methocarbamol (ROBAXIN) 500 MG tablet Take 1 tablet (500 mg total) by mouth 2 (two) times daily with a meal. 11/18/13  Yes Allena KatzEric K Phillips, PA-C  Multiple Vitamins-Minerals (MULTIVITAMIN WITH MINERALS) tablet Take 1 tablet by mouth daily.   Yes Historical Provider, MD  testosterone (ANDROGEL) 50 MG/5GM (1%) GEL Place 5 g onto the skin daily. One onto Each Shoulder Daily.   Yes Historical Provider, MD  traMADol (ULTRAM) 50 MG tablet Take 50 mg by mouth every 6 (six) hours as needed (pain).   Yes Historical Provider, MD  cephALEXin (KEFLEX) 500 MG capsule Take 1 capsule (500 mg total) by mouth 3 (three) times daily. 06/25/14   Elwin MochaBlair Frederic Tones, MD   BP 115/66  Pulse 77  Temp(Src) 98.1 F (36.7 C) (Oral)  Resp 16  SpO2 100% Physical Exam  Nursing note and vitals reviewed. Constitutional: He is oriented to person, place, and time. He appears well-developed and well-nourished. No  distress.  HENT:  Head: Normocephalic and atraumatic.  Mouth/Throat: No oropharyngeal exudate.  Eyes: EOM are normal. Pupils are equal, round, and reactive to light.  Neck: Normal range of motion. Neck supple.  Cardiovascular: Normal rate and regular rhythm.  Exam reveals no friction rub.   No murmur heard. Pulmonary/Chest: Effort normal and breath sounds normal. No respiratory distress. He has no wheezes. He has no rales.  Abdominal: He exhibits no distension. There is no tenderness. There is no rebound.  Musculoskeletal: Normal range of motion. He exhibits no edema.       Hands: Abrasion drawn,  top layer of skin scraped off  Neurological: He is alert and oriented to person, place, and time.  Skin: He is not diaphoretic.    ED Course  Procedures (including critical care time) Labs Review Labs Reviewed - No data to display  Imaging Review No results found.   EKG Interpretation None      MDM   Final diagnoses:  Hand abrasion, right, initial encounter     30M presents with R hand injury. Shaved off top layer of skin approximately 0.5 cm in width across palmar crease about 3 cm long. No need for laceration, injury is more of a scrape. Tetanus updated, wound care provided. Placed on keflex for infection concerns voiced by patient.     Elwin MochaBlair Elorah Dewing, MD 06/25/14 2106

## 2014-06-25 NOTE — Discharge Instructions (Signed)

## 2014-07-14 ENCOUNTER — Emergency Department (HOSPITAL_COMMUNITY)
Admission: EM | Admit: 2014-07-14 | Discharge: 2014-07-14 | Disposition: A | Discharge status: Home / Self Care | Payer: BC Managed Care – PPO | Attending: Emergency Medicine | Admitting: Emergency Medicine

## 2014-07-14 ENCOUNTER — Encounter (HOSPITAL_COMMUNITY): Payer: Self-pay | Admitting: Emergency Medicine

## 2014-07-14 DIAGNOSIS — Z79899 Other long term (current) drug therapy: Secondary | ICD-10-CM | POA: Diagnosis not present

## 2014-07-14 DIAGNOSIS — Z862 Personal history of diseases of the blood and blood-forming organs and certain disorders involving the immune mechanism: Secondary | ICD-10-CM | POA: Insufficient documentation

## 2014-07-14 DIAGNOSIS — E039 Hypothyroidism, unspecified: Secondary | ICD-10-CM | POA: Insufficient documentation

## 2014-07-14 DIAGNOSIS — Z8776 Personal history of (corrected) congenital malformations of integument, limbs and musculoskeletal system: Secondary | ICD-10-CM | POA: Insufficient documentation

## 2014-07-14 DIAGNOSIS — Z8719 Personal history of other diseases of the digestive system: Secondary | ICD-10-CM | POA: Diagnosis not present

## 2014-07-14 DIAGNOSIS — Z792 Long term (current) use of antibiotics: Secondary | ICD-10-CM | POA: Diagnosis not present

## 2014-07-14 DIAGNOSIS — F1010 Alcohol abuse, uncomplicated: Secondary | ICD-10-CM | POA: Diagnosis present

## 2014-07-14 DIAGNOSIS — R1013 Epigastric pain: Secondary | ICD-10-CM | POA: Insufficient documentation

## 2014-07-14 HISTORY — DX: Depression, unspecified: F32.A

## 2014-07-14 HISTORY — DX: Major depressive disorder, single episode, unspecified: F32.9

## 2014-07-14 LAB — RAPID URINE DRUG SCREEN, HOSP PERFORMED
Amphetamines: NOT DETECTED
Barbiturates: NOT DETECTED
Benzodiazepines: NOT DETECTED
Cocaine: NOT DETECTED
Opiates: NOT DETECTED
Tetrahydrocannabinol: NOT DETECTED

## 2014-07-14 LAB — COMPREHENSIVE METABOLIC PANEL
ALT: 48 U/L (ref 0–53)
ANION GAP: 14 (ref 5–15)
AST: 44 U/L — ABNORMAL HIGH (ref 0–37)
Albumin: 4.6 g/dL (ref 3.5–5.2)
Alkaline Phosphatase: 69 U/L (ref 39–117)
BUN: 12 mg/dL (ref 6–23)
CO2: 26 mEq/L (ref 19–32)
Calcium: 9.9 mg/dL (ref 8.4–10.5)
Chloride: 100 mEq/L (ref 96–112)
Creatinine, Ser: 1 mg/dL (ref 0.50–1.35)
GFR calc non Af Amer: >90 mL/min (ref >90)
GLUCOSE: 101 mg/dL — AB (ref 70–99)
Potassium: 4.1 mEq/L (ref 3.7–5.3)
SODIUM: 140 meq/L (ref 137–147)
TOTAL PROTEIN: 8 g/dL (ref 6.0–8.3)
Total Bilirubin: 1.7 mg/dL — ABNORMAL HIGH (ref 0.3–1.2)

## 2014-07-14 LAB — CBC
HCT: 32.8 % — ABNORMAL LOW (ref 39.0–52.0)
HEMOGLOBIN: 10.7 g/dL — AB (ref 13.0–17.0)
MCH: 20.9 pg — AB (ref 26.0–34.0)
MCHC: 32.6 g/dL (ref 30.0–36.0)
MCV: 64.2 fL — ABNORMAL LOW (ref 78.0–100.0)
Platelets: 142 10*3/uL — ABNORMAL LOW (ref 150–400)
RBC: 5.11 MIL/uL (ref 4.22–5.81)
RDW: 15 % (ref 11.5–15.5)
WBC: 4.4 10*3/uL (ref 4.0–10.5)

## 2014-07-14 LAB — ACETAMINOPHEN LEVEL: Acetaminophen (Tylenol), Serum: <15 ug/mL (ref 10–30)

## 2014-07-14 LAB — ETHANOL: Alcohol, Ethyl (B): <11 mg/dL (ref 0–11)

## 2014-07-14 LAB — SALICYLATE LEVEL

## 2014-07-14 MED ORDER — LORAZEPAM 1 MG PO TABS
1.0000 mg | ORAL_TABLET | Freq: Three times a day (TID) | ORAL | Status: DC | PRN
  Administered 2014-07-14: 1 mg via ORAL
  Filled 2014-07-14: qty 1

## 2014-07-14 MED ORDER — IBUPROFEN 200 MG PO TABS
600.0000 mg | ORAL_TABLET | Freq: Three times a day (TID) | ORAL | Status: DC | PRN
  Administered 2014-07-14: 600 mg via ORAL
  Filled 2014-07-14: qty 3

## 2014-07-14 MED ORDER — ACETAMINOPHEN 325 MG PO TABS: 650.0000 mg | ORAL_TABLET | ORAL | Status: DC | PRN

## 2014-07-14 MED ORDER — LEVOTHYROXINE SODIUM 50 MCG PO TABS
50.0000 ug | ORAL_TABLET | Freq: Every day | ORAL | Status: DC
  Filled 2014-07-14: qty 1

## 2014-07-14 MED ORDER — GI COCKTAIL ~~LOC~~
30.0000 mL | Freq: Once | ORAL | Status: AC
  Administered 2014-07-14: 30 mL via ORAL
  Filled 2014-07-14: qty 30

## 2014-07-14 MED ORDER — ZOLPIDEM TARTRATE 5 MG PO TABS
5.0000 mg | ORAL_TABLET | Freq: Every evening | ORAL | Status: DC | PRN
  Filled 2014-07-14: qty 1

## 2014-07-14 NOTE — ED Notes (Signed)
Pt has in belonging bag:  Blue jeans, brown belt, gray tennis shoes, black socks, gray long sleeve shirt

## 2014-07-14 NOTE — ED Provider Notes (Signed)
CSN: [id number]     Arrival date & time 07/14/14  1724 History   First MD Initiated Contact with Patient 07/14/14 1855     Chief Complaint  Patient presents with  . detox    (Consider location/radiation/quality/duration/timing/severity/associated sxs/prior Treatment) HPI  Cody Mcdonald is a 38 yo male requesting detox from ETOH and pain meds.  He states he has been through detox in 2001 and abstained from ETOH for 6 years and then relapsed.  He reports drinking 15-20, 12 oz beers daily.  His last drink was 3 nights ago.  He also has been taking tramadol, ambien and robaxin but states he doesn't really need them. He denies smoking or other recreational drug use.  He endorses some pain in his abd that radiates up to his throat.  He reports feeling anxious but denies nausea, vomiting or headaches currently, he also denies SI/HI.    Past Medical History  Diagnosis Date  . Hypothyroidism   . GERD (gastroesophageal reflux disease)   . Hip dysplasia, congenital   . Anemia     thalassemia   Past Surgical History  Procedure Laterality Date  . Wisdom tooth extraction  12/2010  . 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 Total hip arthroplasty Left 10/07/2013    Procedure: TOTAL HIP ARTHROPLASTY;  Surgeon: Nestor LewandowskyFrank J Rowan, MD;  Location: [facility] OR;  Service: Orthopedics;  Laterality: Left;  . Total hip arthroplasty Right 11/18/2013    Procedure: TOTAL HIP ARTHROPLASTY;  Surgeon: Nestor LewandowskyFrank J Rowan, MD;  Location: [facility] OR;  Service: Orthopedics;  Laterality: Right;   No family history on file. History  Substance Use Topics  . Smoking status: Never Smoker   . Smokeless tobacco: Former NeurosurgeonUser    Types: Chew  . Alcohol Use: No    Review of Systems  Constitutional: Negative for fever and chills.  HENT: Negative for sore throat.   Eyes: Negative for visual disturbance.  Respiratory: Negative for cough and shortness of breath.   Cardiovascular: Negative for chest pain and leg swelling.   Gastrointestinal: Negative for nausea, vomiting and diarrhea.  Genitourinary: Negative for dysuria.  Musculoskeletal: Negative for myalgias.  Skin: Negative for rash.  Neurological: Negative for weakness, numbness and headaches.  Psychiatric/Behavioral: Negative for suicidal ideas and hallucinations.    Allergies  Review of patient's allergies indicates no known allergies.  Home Medications   Prior to Admission medications   Medication Sig Start Date End Date Taking? Authorizing Provider  cephALEXin (KEFLEX) 500 MG capsule Take 1 capsule (500 mg total) by mouth 3 (three) times daily. 06/25/14   Elwin MochaBlair Walden, MD  doxylamine, Sleep, (UNISOM) 25 MG tablet Take 25 mg by mouth at bedtime as needed for sleep (sleep).    Historical Provider, MD  levothyroxine (SYNTHROID, LEVOTHROID) 50 MCG tablet Take 50 mcg by mouth daily before breakfast.    Historical Provider, MD  methocarbamol (ROBAXIN) 500 MG tablet Take 1 tablet (500 mg total) by mouth 2 (two) times daily with a meal. 11/18/13   Allena KatzEric K Phillips, PA-C  Multiple Vitamins-Minerals (MULTIVITAMIN WITH MINERALS) tablet Take 1 tablet by mouth daily.    Historical Provider, MD  testosterone (ANDROGEL) 50 MG/5GM (1%) GEL Place 5 g onto the skin daily. One onto Each Shoulder Daily.    Historical Provider, MD  traMADol (ULTRAM) 50 MG tablet Take 50 mg by mouth every 6 (six) hours as needed (pain).    Historical Provider, MD   BP 143/81 mmHg  Pulse 84  Temp(Src) 98.7 F (37.1 C) (Oral)  Resp 20  SpO2 99% Physical Exam  Constitutional: He is oriented to person, place, and time. He appears well-developed and well-nourished. No distress.  HENT:  Head: Normocephalic and atraumatic.  Mouth/Throat: Oropharynx is clear and moist. No oropharyngeal exudate.  Eyes: Conjunctivae are normal.  Neck: Neck supple. No thyromegaly present.  Cardiovascular: Normal rate, regular rhythm and intact distal pulses.   Pulmonary/Chest: Effort normal and breath  sounds normal. No respiratory distress. He has no wheezes. He has no rales. He exhibits no tenderness.  Abdominal: Soft. There is tenderness in the epigastric area.  Musculoskeletal: He exhibits no tenderness.  Lymphadenopathy:    He has no cervical adenopathy.  Neurological: He is alert and oriented to person, place, and time. No cranial nerve deficit. Coordination normal.  Skin: Skin is warm and dry. No rash noted. He is not diaphoretic.  Psychiatric: He has a normal mood and affect.  Nursing note and vitals reviewed.   ED Course  Procedures (including critical care time) Labs Review Labs Reviewed  CBC - Abnormal; Notable for the following:    Hemoglobin 10.7 (*)    HCT 32.8 (*)    MCV 64.2 (*)    MCH 20.9 (*)    Platelets 142 (*)    All other components within normal limits  COMPREHENSIVE METABOLIC PANEL - Abnormal; Notable for the following:    Glucose, Bld 101 (*)    AST 44 (*)    Total Bilirubin 1.7 (*)    All other components within normal limits  SALICYLATE LEVEL - Abnormal; Notable for the following:    Salicylate Lvl <2.0 (*)    All other components within normal limits  ACETAMINOPHEN LEVEL  ETHANOL  URINE RAPID DRUG SCREEN (HOSP PERFORMED)    Imaging Review No results found.   EKG Interpretation None      MDM   Final diagnoses:  ETOH abuse   38 yo male requesting detox from ETOH. He is alert and well appearing in the ED.  He endorses his last ETOH drink was 3 days ago.  He has been been medically cleared in the ED and is awaiting consult by TTS team for possible placement vs OP clinic information. Pt is currently not having SI or HI and appears stable in NAD. [facility]    Filed Vitals:   07/14/14 1741 07/14/14 1935 07/14/14 2147  BP: 143/81 138/92 141/73  Pulse: 84 77 80  Temp: 98.7 F (37.1 C) 98.7 F (37.1 C) 98.5 F (36.9 C)  TempSrc: Oral Oral Oral  Resp: 20 18 16   SpO2: 99% 100% 96%   Meds given in  ED:  Medications  levothyroxine (SYNTHROID, LEVOTHROID) tablet 50 mcg (not administered)  LORazepam (ATIVAN) tablet 1 mg (1 mg Oral Given 07/14/14 2116)  acetaminophen (TYLENOL) tablet 650 mg (not administered)  ibuprofen (ADVIL,MOTRIN) tablet 600 mg (600 mg Oral Given 07/14/14 2116)  zolpidem (AMBIEN) tablet 5 mg (not administered)  gi cocktail (Maalox,Lidocaine,Donnatal) (30 mLs Oral Given 07/14/14 2029)    Discharge Medication List as of 07/14/2014  9:56 PM         Harle BattiestElizabeth Rudolfo Brandow, NP 07/15/14 0025  Arby BarretteMarcy Pfeiffer, MD 07/15/14 0028

## 2014-07-14 NOTE — Discharge Instructions (Signed)
Alcohol Use Disorder °Alcohol use disorder is a mental disorder. It is not a one-time incident of heavy drinking. Alcohol use disorder is the excessive and uncontrollable use of alcohol over time that leads to problems with functioning in one or more areas of daily living. People with this disorder risk harming themselves and others when they drink to excess. Alcohol use disorder also can cause other mental disorders, such as mood and anxiety disorders, and serious physical problems. People with alcohol use disorder often misuse other drugs.  °Alcohol use disorder is common and widespread. Some people with this disorder drink alcohol to cope with or escape from negative life events. Others drink to relieve chronic pain or symptoms of mental illness. People with a family history of alcohol use disorder are at higher risk of losing control and using alcohol to excess.  °SYMPTOMS  °Signs and symptoms of alcohol use disorder may include the following:  °· Consumption of alcohol in larger amounts or over a longer period of time than intended. °· Multiple unsuccessful attempts to cut down or control alcohol use.   °· A great deal of time spent obtaining alcohol, using alcohol, or recovering from the effects of alcohol (hangover). °· A strong desire or urge to use alcohol (cravings).   °· Continued use of alcohol despite problems at work, school, or home because of alcohol use.   °· Continued use of alcohol despite problems in relationships because of alcohol use. °· Continued use of alcohol in situations when it is physically hazardous, such as driving a car. °· Continued use of alcohol despite awareness of a physical or psychological problem that is likely related to alcohol use. Physical problems related to alcohol use can involve the brain, heart, liver, stomach, and intestines. Psychological problems related to alcohol use include intoxication, depression, anxiety, psychosis, delirium, and dementia.   °· The need for  increased amounts of alcohol to achieve the same desired effect, or a decreased effect from the consumption of the same amount of alcohol (tolerance). °· Withdrawal symptoms upon reducing or stopping alcohol use, or alcohol use to reduce or avoid withdrawal symptoms. Withdrawal symptoms include: °¨ Racing heart. °¨ Hand tremor. °¨ Difficulty sleeping. °¨ Nausea. °¨ Vomiting. °¨ Hallucinations. °¨ Restlessness. °¨ Seizures. °DIAGNOSIS °Alcohol use disorder is diagnosed through an assessment by your health care provider. Your health care provider may start by asking three or four questions to screen for excessive or problematic alcohol use. To confirm a diagnosis of alcohol use disorder, at least two symptoms must be present within a 12-month period. The severity of alcohol use disorder depends on the number of symptoms: °· Mild--two or three. °· Moderate--four or five. °· Severe--six or more. °Your health care provider may perform a physical exam or use results from lab tests to see if you have physical problems resulting from alcohol use. Your health care provider may refer you to a mental health professional for evaluation. °TREATMENT  °Some people with alcohol use disorder are able to reduce their alcohol use to low-risk levels. Some people with alcohol use disorder need to quit drinking alcohol. When necessary, mental health professionals with specialized training in substance use treatment can help. Your health care provider can help you decide how severe your alcohol use disorder is and what type of treatment you need. The following forms of treatment are available:  °· Detoxification. Detoxification involves the use of prescription medicines to prevent alcohol withdrawal symptoms in the first week after quitting. This is important for people with a history of symptoms   of withdrawal and for heavy drinkers who are likely to have withdrawal symptoms. Alcohol withdrawal can be dangerous and, in severe cases, cause  death. Detoxification is usually provided in a hospital or in-patient substance use treatment facility. °· Counseling or talk therapy. Talk therapy is provided by substance use treatment counselors. It addresses the reasons people use alcohol and ways to keep them from drinking again. The goals of talk therapy are to help people with alcohol use disorder find healthy activities and ways to cope with life stress, to identify and avoid triggers for alcohol use, and to handle cravings, which can cause relapse. °· Medicines. Different medicines can help treat alcohol use disorder through the following actions: °¨ Decrease alcohol cravings. °¨ Decrease the positive reward response felt from alcohol use. °¨ Produce an uncomfortable physical reaction when alcohol is used (aversion therapy). °· Support groups. Support groups are run by people who have quit drinking. They provide emotional support, advice, and guidance. °These forms of treatment are often combined. Some people with alcohol use disorder benefit from intensive combination treatment provided by specialized substance use treatment centers. Both inpatient and outpatient treatment programs are available. °Document Released: 09/22/2004 Document Revised: 12/30/2013 Document Reviewed: 11/22/2012 °ExitCare® Patient Information ©2015 [employer]. This information is not intended to replace advice given to you by your health care provider. Make sure you discuss any questions you have with your health care provider. ° ° ° ° °Emergency Department Resource Guide °1) Find a Doctor and Pay Out of Pocket °Although you won't have to find out who is covered by your insurance plan, it is a good idea to ask around and get recommendations. You will then need to call the office and see if the doctor you have chosen will accept you as a new patient and what types of options they offer for patients who are self-pay. Some doctors offer discounts or will set up payment plans for  their patients who do not have insurance, but you will need to ask so you aren't surprised when you get to your appointment. ° °2) Contact Your Local Health Department °Not all health departments have doctors that can see patients for sick visits, but many do, so it is worth a call to see if yours does. If you don't know where your local health department is, you can check in your phone book. The CDC also has a tool to help you locate your state's health department, and many state websites also have listings of all of their local health departments. ° °3) Find a Walk-in Clinic °If your illness is not likely to be very severe or complicated, you may want to try a walk in clinic. These are popping up all over the country in pharmacies, drugstores, and shopping centers. They're usually staffed by nurse practitioners or physician assistants that have been trained to treat common illnesses and complaints. They're usually fairly quick and inexpensive. However, if you have serious medical issues or chronic medical problems, these are probably not your best option. ° °No Primary Care Doctor: °- Call [facility] at  832-8000 - they can help you locate a primary care doctor that  accepts your insurance, provides certain services, etc. °- Physician Referral Service- 1-800-533-3463 ° °Chronic Pain Problems: °Organization         Address  Phone   Notes  °[facility]  (336) 297-2271 Patients need to be referred by their primary care doctor.  ° °Medication Assistance: °Organization           Address  Phone   Notes  °[place] [facility] [address] °[place], [place] [address] (336) 641-8030 --Must be a resident of [place] °-- Must have NO insurance coverage whatsoever (no Medicaid/ Medicare, etc.) °-- The pt. MUST have a primary care doctor that directs their care regularly and follows them in the community °  °[facility]  (866) 331-1348   °[employer]  (888)  892-1162   ° °Agencies that provide inexpensive medical care: °Organization         Address  Phone   Notes  °[facility]  (336) 832-8035   °[facility]    (336) 832-7272   °[facility] [address] °[place], [place] [address] (336) 832-4777   °[facility] [address], °[place] (336) 271-4999   °[facility]    (336) 373-0678   °[facility]    (336) 272-1050   °[facility] ° [address], [place] Phone:  (336) 832-4444, Fax:  (336) 832-4440 Hours of Operation:  9 am - 6 pm, M-F.  Also accepts Medicaid/Medicare and self-pay.  °[facility] ° [address], [place] Phone: (336) [phone], Fax: (336) [phone]. Hours of Operation:  8:30 am - 5:30 pm, M-F.  Also accepts Medicaid and self-pay.  °[facility] [place] [address], [place] Phone: (336) 878-6027   °[facility] [address], [place], [place] (336)723-1848, Ext. 123 Mondays & Thursdays: 7-9 AM.  First 15 patients are seen on a first come, first serve basis. °  ° °Medicaid-accepting [place] Providers: ° °Organization         Address  Phone   Notes  °[facility] [address], [place] (336) 641-2100 Also accepts self-pay patients.  °[facility] [address], [place] ° (336) 856-9996   °[facility] [address], [place] (336) 288-8857   °[facility] [address] [place] [address], [place] (336) 299-7000   °Veita Bland [address], [place]  ° (336) 373-1557 Only accepts [place] Access Medicaid patients after they have their name applied to their card.  ° °Self-Pay (no insurance) in [place]: ° °Organization         Address  Phone   Notes  °Sickle Cell Patients, [facility] [address], [place] (336)  832-1970   °[facility] [address], [place] (336) 832-4400   °[facility] ° [address],  (336) 992-4800   °[facility]/Dr. Osei-Bonsu ° [address] [place] [address], [place] or [address], [place] (336) 841-8500 Phone number for both [place] and [place] locations is the same.  °[facility] [address], [place] (336) 299-0000   °[facility] [address] [place] [address], [place] or [address] (336) 852-7530 °(336) 878-2260   °[facility] [address], [place] (336) 350-1642, phone; (336) 294-5005, fax Sees patients 1st and 3rd Saturday of every month.  Must not qualify for public or private insurance (i.e. Medicaid, Medicare, [place] Health Choice, Veterans' Benefits) • Household income should be no more than 200% of the poverty level •The clinic cannot treat you if you are pregnant or think you   are pregnant • Sexually transmitted diseases are not treated at the clinic.  ° ° °Dental Care: °Organization         Address  Phone  Notes  °[facility] [address], [place] (336) 641-6152 Accepts children up to age 21 who are enrolled in Medicaid or [place] Health Choice; pregnant women with a Medicaid card; and children who have applied for Medicaid or [place] Health Choice, but were declined, whose parents can pay a reduced fee at time of service.  °[facility] [place]  [address], [place] (336) 641-7733 Accepts children up to age 21 who are enrolled in Medicaid or [place] Health Choice; pregnant women with a Medicaid card; and children who have applied for Medicaid or [place] Health Choice, but were declined, whose parents can pay a reduced fee at time of service.  °[facility] ° [address], [place] (336) 641-4533 Patients are  seen by appointment only. Walk-ins are not accepted. [facility] will see patients 18 years of age and older. °Monday - Tuesday (8am-5pm) °Most Wednesdays (8:30-5pm) °$30 per visit, cash only  °[facility] ° [address], [place] (336) 641-4533 Patients are seen by appointment only. Walk-ins are not accepted. [facility] will see patients 18 years of age and older. °One Wednesday Evening (Monthly: Volunteer Based).  $30 per visit, cash only  °[facility]  (919) 537-3737 for adults; Children under age 4, call Graduate Pediatric Dentistry at ([phone]) [phone]. Children aged 4-14, please call ([phone]) [phone] to request a pediatric application. ° Dental services are provided in all areas of dental care including fillings, crowns and bridges, complete and partial dentures, implants, gum treatment, root canals, and extractions. Preventive care is also provided. Treatment is provided to both adults and children. °Patients are selected via a lottery and there is often a waiting list. °  °[facility] [address], °[place] ° (336) 763-8833 [email or web address] °  °[facility] [address], [place], [place] (336)723-1848, Ext. 123 Second and Fourth Thursday of each month, opens at 6:30 AM; Clinic ends at 9 AM.  Patients are seen on a first-come first-served basis, and a limited number are seen during each clinic.  ° °[facility] ° [address], [place], [place] (336) 723-7904   Eligibility Requirements °You must have lived in [place], [place], or [place] counties for at least the last three months. °  You cannot be eligible for state or federal sponsored healthcare insurance, including Veterans Administration, Medicaid, or Medicare. °  You generally cannot be eligible for healthcare insurance through your employer.  °  How to apply: °Eligibility screenings are held every Tuesday and Wednesday afternoon from 1:00 pm until 4:00  pm. You do not need an appointment for the interview!  °[facility] [address], [place], [place] 336-631-2330   °[facility]  336-342-8273   °[facility]  336-703-3100   °[facility]  336-570-6415   ° °Behavioral Health Resources in the Community: °Intensive Outpatient Programs °Organization         Address  Phone  Notes  °[facility] [address], [place], [place] 336-878-6098   °[facility] [address], [place], [place] 336-832-9800   °[facility] [address], [place], [place] ° 336-882-2125   °[facility] [address],  °[place], [place] 1-800-853-5163 or 336-641-4981   °Substance Abuse Resources °Organization         Address  Phone  Notes  °[facility]  336-882-2125   °[facility]  336-784-9470   °[facility]  336-285-9073   °[facility]  336-845-3988   °Residential & Outpatient Substance Abuse Program  1-800-659-3381   °Psychological Services °Organization         Address  Phone  Notes  °[facility]  336- 832-9600   °[facility]  336- 378-7881   °[facility] [address], [place] 1-800-853-5163 or 336-641-4981   ° °Mobile Crisis Teams °Organization         Address  Phone  Notes  °[facility], Mobile Crisis Care Unit  1-877-626-1772   °Assertive °[facility] ° [address] [place], [place] 336-834-9664   °Sharon DeEsch [address] °[place] 336-554-5454   ° °Self-Help/Support Groups °Organization         Address  Phone             Notes  °[facility] - variety of support groups  336- 373-1402 Call for more information  °Narcotics Anonymous (NA), [facility] [address], °[place]  2 meetings at this location  ° °Residential Treatment Programs °Organization          Address  Phone  Notes  °[facility] [address],    °[place]  1-866-801-8205   °[facility] ° [address], [place], [place] 704-293-8524   °[facility] [address], [place] 336-845-3988 Admissions: 8am-3pm M-F  °[facility] [address]    °[place], [place] 336-841-1104   °[facility] [address], [place], [place] 336-379-7146   °[facility] [address]  °[place], [place] 336-285-9073   °[facility] - Intensive Outpatient [address], [place], [place] 336-852-3033   °[facility] [address]  °[place], [place] 1-877-615-2722 or 336-784-9470   °[facility] [address] [place], [place] 336-227-7417 Accepts Medicaid  °[facility] [address]  °[place] 1-800-659-3381 Substance Abuse/Addiction Treatment  ° °[place] Behavioral Health Resources °Organization         Address  Phone  Notes  °[facility]  (888) 581-9988   °Julie Brannon, PhD [address] [place], [place]   (336) 349-5553 or (336) 951-0000   °[facility]   [address] °[place], [place] (336) 349-4454   °[facility] [address], [place], [place] (336) 342-8316 Insurance/Medicaid/sponsorship through [facility]  °[facility] [address]                                    [place], [place] (336) 342-8316 Therapy/tele-psych/case  °[facility] [address]  ° [place], [place] (336) 349-2233    °Dr. Arfeen  (336) 349-4544   °[facility] [place]  [employer] [place] [facility] 1) [address], [place] °2) [address], [place] °3)  [address], [place] (336) 349-3220 °(336) 342-7768 ° °(336) 342-8140   °[place] [facility] (336)   342-1394 or (336) 342-3537 (After Hours)    ° ° ° °

## 2014-07-14 NOTE — ED Notes (Signed)
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 Last use Ambien and tramadol today, last use of etoh Friday night.

## 2014-07-14 NOTE — ED Notes (Signed)
Assessment counselor at bedside.  

## 2014-07-14 NOTE — ED Notes (Addendum)
Pt states he wants treatment for detox: ETOH and prescription medications. 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 He doesn't need these medications usually but he just "wants them." He has had treatment for detox in the past and seen psychiatrist in the past. Per pt, [facility] but was instructed to come to hospital with chances to be transferred.

## 2014-07-14 NOTE — ED Notes (Signed)
Pt was escorted  from triage to TCU by [name]. Father is awaiting at the desk for pt. For visitation.

## 2014-07-15 NOTE — BH Assessment (Signed)
Tele Assessment Note   Cody Mcdonald is a 38 y.o. male who [facility]iate and benzo detox.  Pt denies SI/HI/AVH. Pt reports the following: he's not consumed any alcohol since Friday(07/11/14), however he drinks 12-15 12oz beers and 3-5 shots, daily.  He also uses 10 opiates pills a day, consisting of muscle relaxers and tramadol.  Pt states that he was prescribed pain pills for a previous hip surgery. His last use was 07/14/14 3-4 pills.  Pt also uses 2 ambien pills, daily.  His last was [facility].  Pt states that he had 6-7 yrs of sobriety and relapsed on 07/213--"I just wanted a buzz".   This writer explained to pt that since he's had no alcohol since 07/11/14, he is in a "detox window" which is 3-4 days and [facility].  This Clinical research associatewriter provided outpatient referral information; Dr. Donnald GarrePfeiffer agreed with disposition.  Pt d/c home with referrals.   Axis I: Alcohol use disorder, severe; Opioid use disorder, Severe; Sedative, hypnotic, or anxiolytic use disorder, Mild Axis II: Deferred Axis III:  Past Medical History  Diagnosis Date  . Hypothyroidism   . GERD (gastroesophageal reflux disease)   . Hip dysplasia, congenital   . Anemia     thalassemia  . Depression    Axis IV: occupational problems, other psychosocial or environmental problems, problems related to social environment and problems with primary support group Axis V: 41-50 serious symptoms  Past Medical History:  Past Medical History  Diagnosis Date  . Hypothyroidism   . GERD (gastroesophageal reflux disease)   . Hip dysplasia, congenital   . Anemia     thalassemia  . Depression     Past Surgical History  Procedure Laterality Date  . Wisdom tooth extraction  12/2010  . 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 Total hip arthroplasty Right 11/18/2013    Procedure: TOTAL HIP ARTHROPLASTY;  Surgeon: Nestor LewandowskyFrank J Rowan, MD;  Location: [facility] OR;  Service: Orthopedics;  Laterality: Right;    Family History: No family history on file.  Social History:  reports that he has never smoked. He has quit using smokeless tobacco. His smokeless tobacco use included Chew. He reports that he uses illicit drugs (Benzodiazepines). He reports that he does not drink alcohol.  Additional Social History:  Alcohol / Drug Use Pain Medications: See MAR  Prescriptions: See MAR  Over the Counter: See MAR  History of alcohol / drug use?: Yes Longest period of sobriety (when/how long): 6-7 yrs soberiety  Negative Consequences of Use: Work / Programmer, multimediachool, Personal relationships Withdrawal Symptoms: Fever / Chills, Tremors Substance #1 Name of Substance 1: Alcohol  1 - Age of First Use: Teens  1 - Amount (size/oz): 12-15 12oz Beers, 3-5 Shots  1 - Frequency: Daily  1 - Duration: On-going  1 - Last Use / Amount: 07/11/14 Substance #2 Name of Substance 2: Opiates--muslce relaxers and tramadol 2 - Age of First Use: 25 YOM  2 - Amount (size/oz): 10 Pills  2 - Frequency: Daily  2 - Duration: On-going  2 - Last Use / Amount: 07/14/14 Substance #3 Name of Substance 3: Ambien  3 - Age of First Use: [age] YOM  3 - Amount (size/oz): 2 Pills  3 - Frequency: Daily  3 - Duration: On-going  3 - Last Use / Amount: 07/14/14  CIWA: CIWA-Ar BP: 141/73 mmHg Pulse Rate: 80 COWS:    PATIENT STRENGTHS: (choose at least two) Supportive family/friends Work skills  Allergies: No Known Allergies  Home Medications:  (Not in a hospital admission)  OB/GYN Status:  No LMP for male patient.  General Assessment Data Location of Assessment: [facility] ED Is this a Tele or Face-to-Face Assessment?: Face-to-Face Is this an Initial Assessment or a Re-assessment for this encounter?: Initial  Assessment Living Arrangements: Alone Can pt return to current living arrangement?: Yes Admission Status: Voluntary Is patient capable of signing voluntary admission?: Yes Transfer from: Home Referral Source: Self/Family/Friend  Medical Screening Exam [facility] Walk-in ONLY) Medical Exam completed: No Reason for MSE not completed: Other: (None )  [facility] Crisis Care Plan Living Arrangements: Alone Name of Psychiatrist: None  Name of Therapist: None   Education Status Is patient currently in school?: No Current Grade: None  Highest grade of school patient has completed: None  Name of school: None  Contact person: None   Risk to self with the past 6 months Suicidal Ideation: No Suicidal Intent: No Is patient at risk for suicide?: No Suicidal Plan?: No Access to Means: No What has been your use of drugs/alcohol within the last 12 months?: Abusing: alcohol, opiates and benzos  Previous Attempts/Gestures: No How many times?: 0 Other Self Harm Risks: None  Triggers for Past Attempts: None known Intentional Self Injurious Behavior: None Family Suicide History: No Recent stressful life event(s): Other (Comment) (Chronic SA; Work related stress ) Persecutory voices/beliefs?: No Depression: Yes Depression Symptoms: Loss of interest in usual pleasures Substance abuse history and/or treatment for substance abuse?: Yes Suicide prevention information given to non-admitted patients: Not applicable  Risk to Others within the past 6 months Homicidal Ideation: No Thoughts of Harm to Others: No Current Homicidal Intent: No Current Homicidal Plan: No Access to Homicidal Means: No Identified Victim: None  History of harm to others?: No Assessment of Violence: None Noted Violent Behavior Description: None  Does patient have access to weapons?: No Criminal Charges Pending?: No Does patient have a court date: No  Psychosis Hallucinations: None noted Delusions: None noted  Mental Status  Report Appear/Hygiene: In scrubs Eye Contact: Fair Motor Activity: Unremarkable Speech: Logical/coherent Level of Consciousness: Alert Mood: Depressed Affect: Depressed Anxiety Level: None Thought Processes: Coherent, Relevant Judgement: Unimpaired Orientation: Person, Place, Time, Situation Obsessive Compulsive Thoughts/Behaviors: None  Cognitive Functioning Concentration: Normal Memory: Recent Intact, Remote Intact IQ: Average Insight: Good Impulse Control: Good Appetite: Fair Weight Loss: 0 Weight Gain: 0 Sleep: Decreased Total Hours of Sleep: 5 Vegetative Symptoms: None  ADLScreening [employer] Assessment Services) Patient's cognitive ability adequate to safely complete daily activities?: Yes Patient able to express need for assistance with ADLs?: Yes Independently performs ADLs?: Yes (appropriate for developmental age)  Prior Inpatient Therapy Prior Inpatient Therapy: Yes Prior Therapy Dates: Unk  Prior Therapy Facilty/Provider(s): [facility]; [facility] Reason for Treatment: Rehab   Prior Outpatient Therapy Prior Outpatient Therapy: No Prior Therapy Dates: None  Prior Therapy Facilty/Provider(s): None  Reason for Treatment: None   ADL Screening (condition at time of admission) Patient's cognitive ability adequate to safely complete daily activities?: Yes Is the patient deaf or have difficulty hearing?: No Does the patient have difficulty seeing, even when wearing glasses/contacts?: No Does the patient have difficulty concentrating, remembering, or making decisions?: No Patient  able to express need for assistance with ADLs?: Yes Does the patient have difficulty dressing or bathing?: No Independently performs ADLs?: Yes (appropriate for developmental age) Does the patient have difficulty walking or climbing stairs?: No Weakness of Legs: None Weakness of Arms/Hands: None  Home Assistive Devices/Equipment Home Assistive Devices/Equipment: None  Therapy Consults  (therapy consults require a physician order) PT Evaluation Needed: No OT Evalulation Needed: No SLP Evaluation Needed: No Abuse/Neglect Assessment (Assessment to be complete while patient is alone) Physical Abuse: Denies Verbal Abuse: Denies Sexual Abuse: Denies Exploitation of patient/patient's resources: Denies Self-Neglect: Denies Values / Beliefs Cultural Requests During Hospitalization: None Spiritual Requests During Hospitalization: None Consults Spiritual Care Consult Needed: No Social Work Consult Needed: No Merchant navy officerAdvance Directives (For Healthcare) Does patient have an advance directive?: No Would patient like information on creating an advanced directive?: No - patient declined information Nutrition Screen- [facility] Patient's home diet: Regular  Additional Information 1:1 In Past 12 Months?: No CIRT Risk: No Elopement Risk: No Does patient have medical clearance?: Yes     Disposition:  Disposition Initial Assessment Completed for this Encounter: Yes Disposition of Patient: Referred to, Outpatient treatment (D/C with referrals ) Type of outpatient treatment: Adult Patient referred to: Outpatient clinic referral (D/C with referrals )  Murrell ReddenSimmons, Baylei Siebels C 07/15/2014 1:26 AM

## 2016-02-13 IMAGING — CR DG PORTABLE PELVIS
2 series · 2 of 2 positions shown · non-contrast
Comparison: DG PELVIS PORTABLE dated 10/07/2013

CLINICAL DATA: Postop

EXAM:
PORTABLE PELVIS 1-2 VIEWS

[AP (1 of 2)]
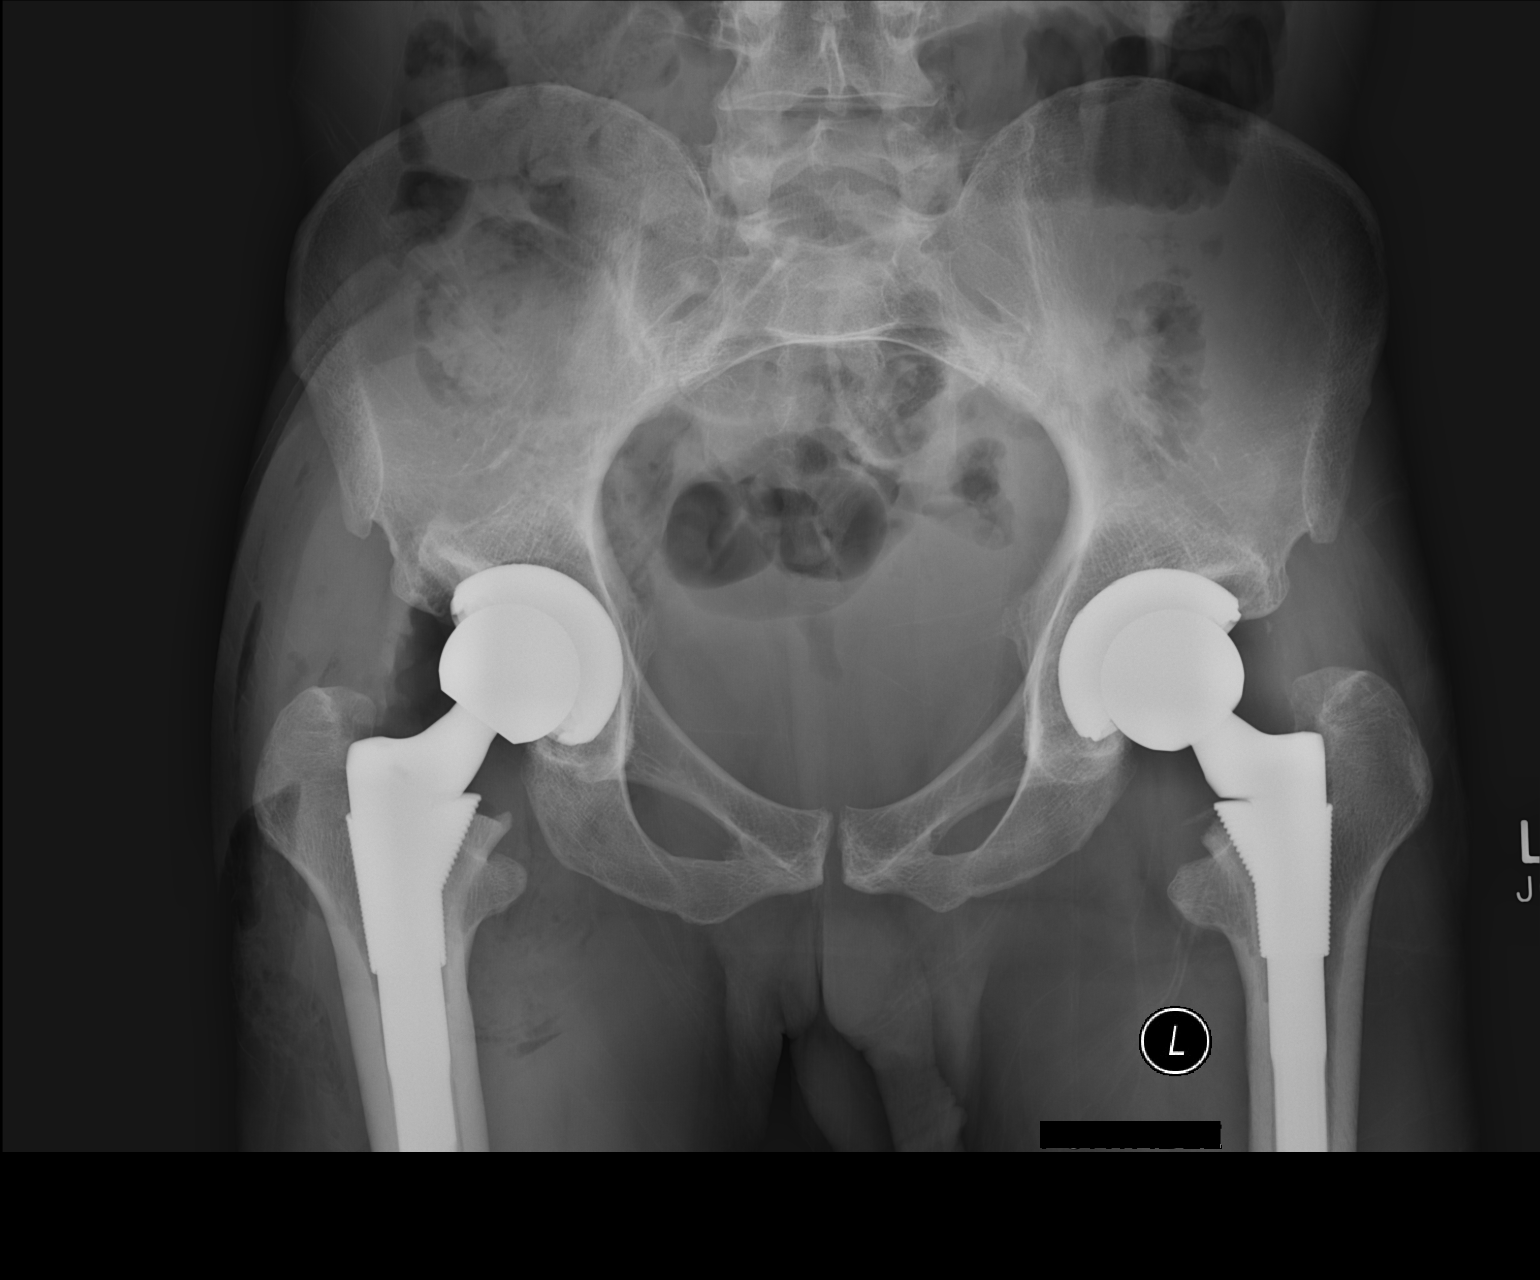

[AP (2 of 2)]
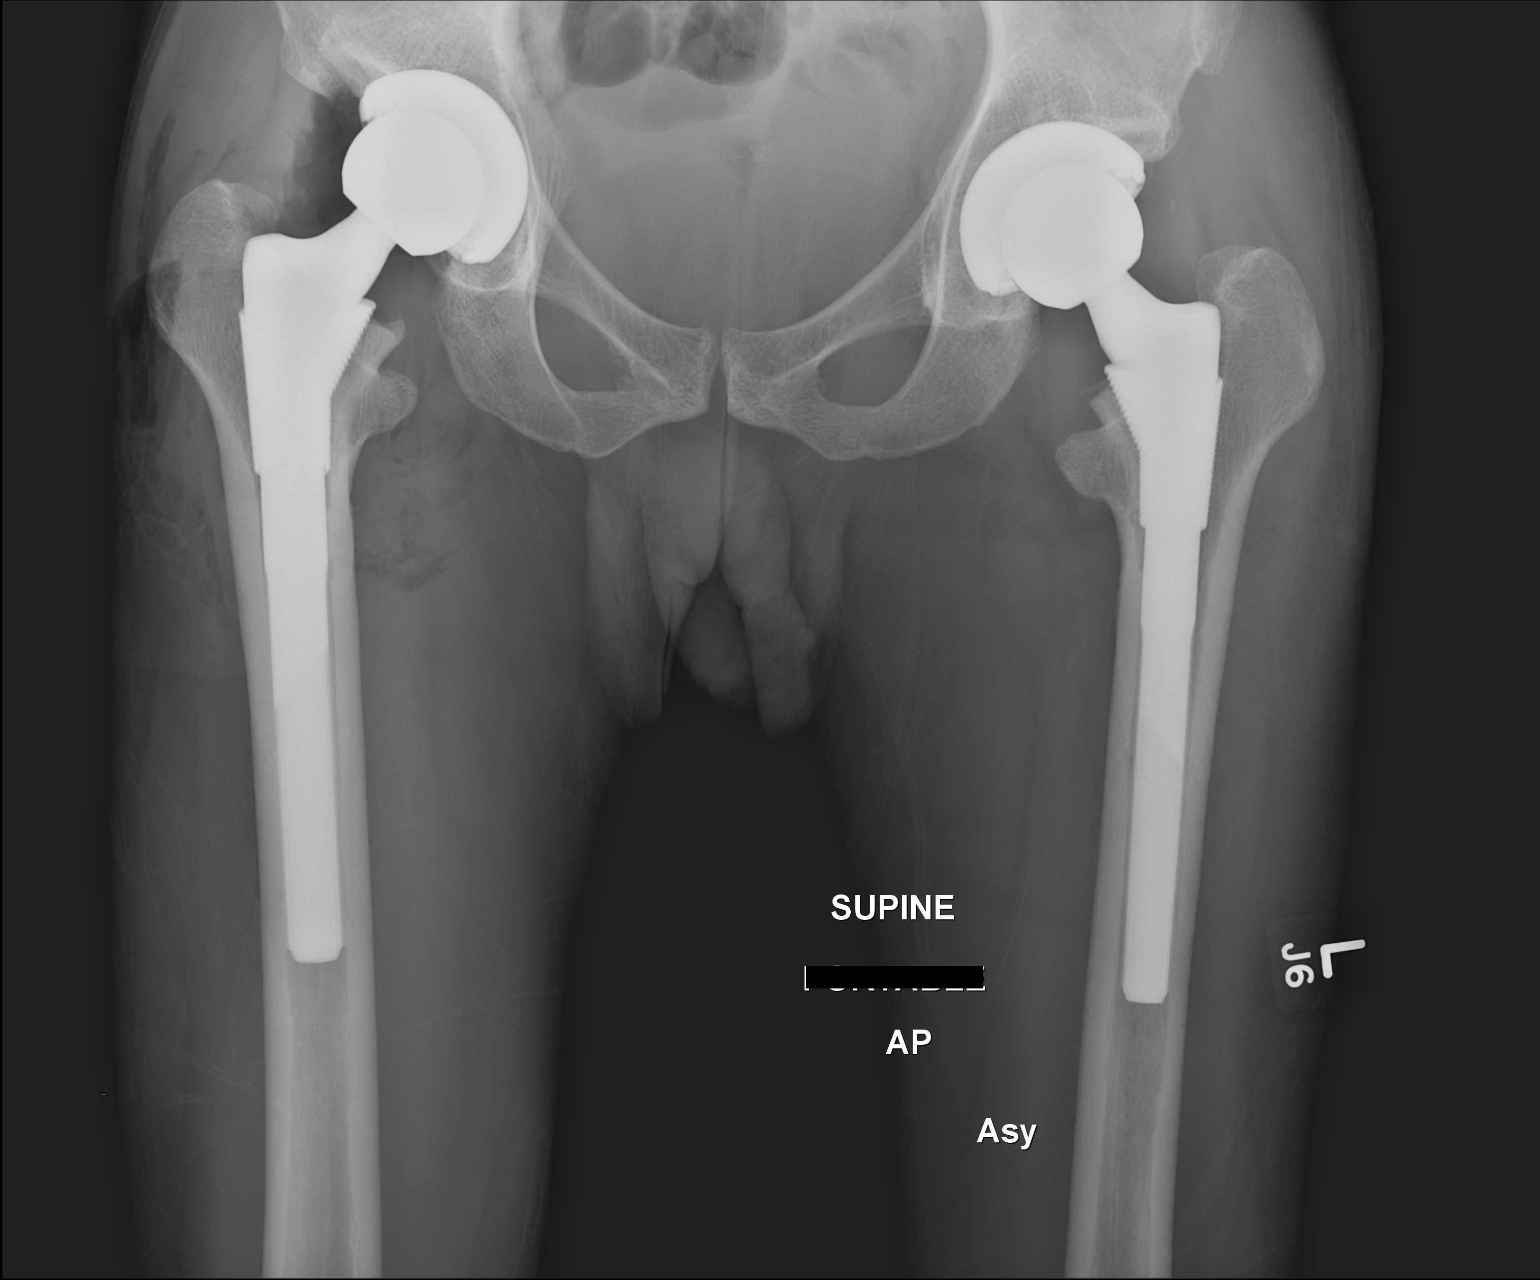

[2 of 2 positions shown; findings below may reference images not displayed]

FINDINGS: Interval right hip total arthroplasty. The femoral component and
acetabular component appear well position. Left arthroplasty noted.
IMPRESSION: Right hip total arthroplasty without complicating features.

## 2021-07-27 ENCOUNTER — Other Ambulatory Visit: Payer: Self-pay | Admitting: Registered Nurse

## 2021-07-27 DIAGNOSIS — R1013 Epigastric pain: Secondary | ICD-10-CM

## 2021-07-27 DIAGNOSIS — R7401 Elevation of levels of liver transaminase levels: Secondary | ICD-10-CM

## 2022-06-08 ENCOUNTER — Encounter (HOSPITAL_COMMUNITY): Payer: Self-pay

## 2022-06-08 ENCOUNTER — Emergency Department (HOSPITAL_COMMUNITY)
Admission: EM | Admit: 2022-06-08 | Discharge: 2022-06-08 | Discharge status: Against Medical Advice | Payer: Self-pay | Attending: Emergency Medicine | Admitting: Emergency Medicine

## 2022-06-08 ENCOUNTER — Other Ambulatory Visit: Payer: Self-pay

## 2022-06-08 DIAGNOSIS — R079 Chest pain, unspecified: Secondary | ICD-10-CM | POA: Insufficient documentation

## 2022-06-08 DIAGNOSIS — Z5321 Procedure and treatment not carried out due to patient leaving prior to being seen by health care provider: Secondary | ICD-10-CM | POA: Insufficient documentation

## 2022-06-08 DIAGNOSIS — R112 Nausea with vomiting, unspecified: Secondary | ICD-10-CM | POA: Insufficient documentation

## 2022-06-08 LAB — COMPREHENSIVE METABOLIC PANEL
ALT: 228 U/L — ABNORMAL HIGH (ref 0–44)
AST: 390 U/L — ABNORMAL HIGH (ref 15–41)
Albumin: 4.2 g/dL (ref 3.5–5.0)
Alkaline Phosphatase: 83 U/L (ref 38–126)
Anion gap: 14 (ref 5–15)
BUN: 5 mg/dL — ABNORMAL LOW (ref 6–20)
CO2: 23 mmol/L (ref 22–32)
Calcium: 9 mg/dL (ref 8.9–10.3)
Chloride: 99 mmol/L (ref 98–111)
Creatinine, Ser: 0.94 mg/dL (ref 0.61–1.24)
GFR, Estimated: >60 mL/min (ref >60)
Glucose, Bld: 99 mg/dL (ref 70–99)
Potassium: 3.9 mmol/L (ref 3.5–5.1)
Sodium: 136 mmol/L (ref 135–145)
Total Bilirubin: 1.5 mg/dL — ABNORMAL HIGH (ref 0.3–1.2)
Total Protein: 7.5 g/dL (ref 6.5–8.1)

## 2022-06-08 LAB — CBC WITH DIFFERENTIAL/PLATELET
Abs Immature Granulocytes: 0.03 10*3/uL (ref 0.00–0.07)
Basophils Absolute: 0.1 10*3/uL (ref 0.0–0.1)
Basophils Relative: 2 %
Eosinophils Absolute: 0.2 10*3/uL (ref 0.0–0.5)
Eosinophils Relative: 4 %
HCT: 35.5 % — ABNORMAL LOW (ref 39.0–52.0)
Hemoglobin: 11.2 g/dL — ABNORMAL LOW (ref 13.0–17.0)
Immature Granulocytes: 1 %
Lymphocytes Relative: 31 %
Lymphs Abs: 1.2 10*3/uL (ref 0.7–4.0)
MCH: 21.9 pg — ABNORMAL LOW (ref 26.0–34.0)
MCHC: 31.5 g/dL (ref 30.0–36.0)
MCV: 69.3 fL — ABNORMAL LOW (ref 80.0–100.0)
Monocytes Absolute: 0.4 10*3/uL (ref 0.1–1.0)
Monocytes Relative: 10 %
Neutro Abs: 2.1 10*3/uL (ref 1.7–7.7)
Neutrophils Relative %: 52 %
Platelets: 180 10*3/uL (ref 150–400)
RBC: 5.12 MIL/uL (ref 4.22–5.81)
RDW: 16.1 % — ABNORMAL HIGH (ref 11.5–15.5)
WBC: 3.9 10*3/uL — ABNORMAL LOW (ref 4.0–10.5)
nRBC: 0 % (ref 0.0–0.2)

## 2022-06-08 LAB — ETHANOL: Alcohol, Ethyl (B): 307 mg/dL (ref <10)

## 2022-06-08 LAB — TROPONIN I (HIGH SENSITIVITY): Troponin I (High Sensitivity): 3 ng/L (ref <18)

## 2022-06-08 LAB — LIPASE, BLOOD: Lipase: 52 U/L — ABNORMAL HIGH (ref 11–51)

## 2022-06-08 LAB — TSH: TSH: 4.704 u[IU]/mL — ABNORMAL HIGH (ref 0.350–4.500)

## 2022-06-08 MED ORDER — LORAZEPAM 1 MG PO TABS
1.0000 mg | ORAL_TABLET | Freq: Once | ORAL | Status: AC
  Administered 2022-06-08: 1 mg via ORAL
  Filled 2022-06-08: qty 1

## 2022-06-08 NOTE — ED Provider Triage Note (Signed)
Emergency Medicine Provider Triage Evaluation Note  Cody Mcdonald , a 46 y.o. male  was evaluated in triage.  Pt complains of chest pain, nausea, vomiting, alcoholism.  Patient reports that he was diagnosed with cirrhosis last year with elevated liver enzymes.  He has not followed up with his PCP.  Patient reports that he has very poor p.o. intake for the past 6 months.  He reports having some chest pain and feels like "a gorilla sitting on his chest and grabbing his throat".  Patient reports a 45 pound weight loss.  Patient reports he has not been taking his thyroid medication as well.  Patient reports daily alcohol use his last drink was earlier today.  No history of withdrawal seizures..  Review of Systems  Positive:    Chest pain, nausea, vomiting, palpitations Negative: Fevers  Physical Exam  BP (!) 137/90 (BP Location: Right Arm)   Pulse 94   Temp 98.1 F (36.7 C) (Oral)   Resp 18   Ht 5\' 10"  (1.778 m)   Wt 63.5 kg   SpO2 97%   BMI 20.09 kg/m  Gen:   Awake, no distress, cachectic appearing appears older than stated age Resp:  Normal effort  MSK:   Moves extremities without difficulty  Other:  Heart regular rate and rhythm.  Lungs clear to auscultation bilaterally.  Medical Decision Making  Medically screening exam initiated at 3:33 PM.  Appropriate orders placed.  Cody Mcdonald was informed that the remainder of the evaluation will be completed by another provider, this initial triage assessment does not replace that evaluation, and the importance of remaining in the ED until their evaluation is complete.  Patient with history of alcohol abuse.  Patient reports poor p.o. intake for the past 36-month associated chest pain and weight loss.  Labs and imaging pending at this time.  Patient provided Ativan to help with withdrawals.  No history of withdrawal seizures.   Doristine Devoid, PA-C 06/08/22 1535

## 2022-06-08 NOTE — ED Notes (Addendum)
[facility] 307. Dr. Sherry Ruffing informed via epic secure chat

## 2022-06-08 NOTE — ED Triage Notes (Addendum)
PCP dx pt in nov with cirrohsis and liver enzymes are elevated and patient continued to drink heavily and has not been back.  Patient has muscle wasting, flaky of skin, hair loss.  Patient has hx of esophagitis.  CP for months.  Had a few beers today and reports when he starts to withdraw he will start sweating, having tachy and n/v Patient also reports he has not been taking his thyroid medication.  Reports he really has not ate food in 6 months all he has done is drank.

## 2022-06-08 NOTE — ED Notes (Signed)
Patient pulled his IV it was found in the trash with blood all around and he yelled he is leaving.

## 2024-06-29 DEATH — deceased
